# Patient Record
Sex: Female | Born: 1965 | Race: White | Hispanic: No | Marital: Married | Smoking: Never smoker
Health system: Southern US, Community
[De-identification: ages and names within clinical notes are randomized; demographics above are authoritative.]

## PROBLEM LIST (undated history)

## (undated) DIAGNOSIS — N390 Urinary tract infection, site not specified: Secondary | ICD-10-CM

## (undated) DIAGNOSIS — R87810 Cervical high risk human papillomavirus (HPV) DNA test positive: Secondary | ICD-10-CM

## (undated) DIAGNOSIS — B009 Herpesviral infection, unspecified: Secondary | ICD-10-CM

## (undated) DIAGNOSIS — R87610 Atypical squamous cells of undetermined significance on cytologic smear of cervix (ASC-US): Secondary | ICD-10-CM

## (undated) HISTORY — DX: Urinary tract infection, site not specified: N39.0

## (undated) HISTORY — DX: Herpesviral infection, unspecified: B00.9

## (undated) HISTORY — PX: TUBAL LIGATION: SHX77

## (undated) HISTORY — DX: Atypical squamous cells of undetermined significance on cytologic smear of cervix (ASC-US): R87.610

## (undated) HISTORY — DX: Cervical high risk human papillomavirus (HPV) DNA test positive: R87.810

---

## 2005-01-09 ENCOUNTER — Ambulatory Visit: Payer: Self-pay | Admitting: Obstetrics and Gynecology

## 2006-07-01 ENCOUNTER — Ambulatory Visit: Payer: Self-pay | Admitting: Obstetrics and Gynecology

## 2008-02-15 ENCOUNTER — Ambulatory Visit: Payer: Self-pay | Admitting: Obstetrics and Gynecology

## 2011-07-09 ENCOUNTER — Ambulatory Visit: Payer: Self-pay | Admitting: Obstetrics and Gynecology

## 2012-07-21 ENCOUNTER — Ambulatory Visit: Payer: Self-pay | Admitting: Obstetrics and Gynecology

## 2015-04-02 ENCOUNTER — Encounter: Payer: Self-pay | Admitting: *Deleted

## 2015-04-02 ENCOUNTER — Ambulatory Visit (INDEPENDENT_AMBULATORY_CARE_PROVIDER_SITE_OTHER): Payer: BLUE CROSS/BLUE SHIELD | Admitting: Obstetrics and Gynecology

## 2015-04-02 VITALS — BP 105/69 | HR 69 | Resp 16 | Ht 63.0 in | Wt 133.6 lb

## 2015-04-02 DIAGNOSIS — Z113 Encounter for screening for infections with a predominantly sexual mode of transmission: Secondary | ICD-10-CM

## 2015-04-02 DIAGNOSIS — R300 Dysuria: Secondary | ICD-10-CM | POA: Diagnosis not present

## 2015-04-02 DIAGNOSIS — B3731 Acute candidiasis of vulva and vagina: Secondary | ICD-10-CM

## 2015-04-02 DIAGNOSIS — B373 Candidiasis of vulva and vagina: Secondary | ICD-10-CM | POA: Diagnosis not present

## 2015-04-02 DIAGNOSIS — R3129 Other microscopic hematuria: Secondary | ICD-10-CM | POA: Diagnosis not present

## 2015-04-02 LAB — URINALYSIS, COMPLETE
BILIRUBIN UA: NEGATIVE
GLUCOSE, UA: NEGATIVE
KETONES UA: NEGATIVE
NITRITE UA: NEGATIVE
PROTEIN UA: NEGATIVE
RBC UA: NEGATIVE
SPEC GRAV UA: 1.01 (ref 1.005–1.030)
UUROB: 0.2 mg/dL (ref 0.2–1.0)
pH, UA: 6.5 (ref 5.0–7.5)

## 2015-04-02 LAB — MICROSCOPIC EXAMINATION: RBC MICROSCOPIC, UA: NONE SEEN /HPF (ref 0–?)

## 2015-04-02 LAB — BLADDER SCAN AMB NON-IMAGING

## 2015-04-02 NOTE — Patient Instructions (Addendum)
Cystoscopy Cystoscopy is a procedure that is used to help your caregiver diagnose and sometimes treat conditions that affect your lower urinary tract. Your lower urinary tract includes your bladder and the tube through which urine passes from your bladder out of your body (urethra). Cystoscopy is performed with a thin, tube-shaped instrument (cystoscope). The cystoscope has lenses and a light at the end so that your caregiver can see inside your bladder. The cystoscope is inserted at the entrance of your urethra. Your caregiver guides it through your urethra and into your bladder. There are two main types of cystoscopy:  Flexible cystoscopy (with a flexible cystoscope).  Rigid cystoscopy (with a rigid cystoscope). Cystoscopy may be recommended for many conditions, including:  Urinary tract infections.  Blood in your urine (hematuria).  Loss of bladder control (urinary incontinence) or overactive bladder.  Unusual cells found in a urine sample.  Urinary blockage.  Painful urination. Cystoscopy may also be done to remove a sample of your tissue to be checked under a microscope (biopsy). It may also be done to remove or destroy bladder stones. LET YOUR CAREGIVER KNOW ABOUT:  Allergies to food or medicine.  Medicines taken, including vitamins, herbs, eyedrops, over-the-counter medicines, and creams.  Use of steroids (by mouth or creams).  Previous problems with anesthetics or numbing medicines.  History of bleeding problems or blood clots.  Previous surgery.  Other health problems, including diabetes and kidney problems.  Possibility of pregnancy, if this applies. PROCEDURE The area around the opening to your urethra will be cleaned. A medicine to numb your urethra (local anesthetic) is used. If a tissue sample or stone is removed during the procedure, you may be given a medicine to make you sleep (general anesthetic). Your caregiver will gently insert the tip of the cystoscope  into your urethra. The cystoscope will be slowly glided through your urethra and into your bladder. Sterile fluid will flow through the cystoscope and into your bladder. The fluid will expand and stretch your bladder. This gives your caregiver a better view of your bladder walls. The procedure lasts about 15-20 minutes. AFTER THE PROCEDURE If a local anesthetic is used, you will be allowed to go home as soon as you are ready. If a general anesthetic is used, you will be taken to a recovery area until you are stable. You may have temporary bleeding and burning on urination.   This information is not intended to replace advice given to you by your health care provider. Make sure you discuss any questions you have with your health care provider.   Document Released: 02/29/2000 Document Revised: 03/24/2014 Document Reviewed: 08/25/2011 Elsevier Interactive Patient Education 2016 Elsevier Inc. Hematuria, Adult Hematuria is blood in your urine. It can be caused by a bladder infection, kidney infection, prostate infection, kidney stone, or cancer of your urinary tract. Infections can usually be treated with medicine, and a kidney stone usually will pass through your urine. If neither of these is the cause of your hematuria, further workup to find out the reason may be needed. It is very important that you tell your health care provider about any blood you see in your urine, even if the blood stops without treatment or happens without causing pain. Blood in your urine that happens and then stops and then happens again can be a symptom of a very serious condition. Also, pain is not a symptom in the initial stages of many urinary cancers. HOME CARE INSTRUCTIONS   Drink lots of fluid, 3-4   quarts a day. If you have been diagnosed with an infection, cranberry juice is especially recommended, in addition to large amounts of water.  Avoid caffeine, tea, and carbonated beverages because they tend to irritate the  bladder.  Avoid alcohol because it may irritate the prostate.  Take all medicines as directed by your health care provider.  If you were prescribed an antibiotic medicine, finish it all even if you start to feel better.  If you have been diagnosed with a kidney stone, follow your health care provider's instructions regarding straining your urine to catch the stone.  Empty your bladder often. Avoid holding urine for long periods of time.  After a bowel movement, women should cleanse front to back. Use each tissue only once.  Empty your bladder before and after sexual intercourse if you are a female. SEEK MEDICAL CARE IF:  You develop back pain.  You have a fever.  You have a feeling of sickness in your stomach (nausea) or vomiting.  Your symptoms are not better in 3 days. Return sooner if you are getting worse. SEEK IMMEDIATE MEDICAL CARE IF:   You develop severe vomiting and are unable to keep the medicine down.  You develop severe back or abdominal pain despite taking your medicines.  You begin passing a large amount of blood or clots in your urine.  You feel extremely weak or faint, or you pass out. MAKE SURE YOU:   Understand these instructions.  Will watch your condition.  Will get help right away if you are not doing well or get worse.   This information is not intended to replace advice given to you by your health care provider. Make sure you discuss any questions you have with your health care provider.   Document Released: 03/03/2005 Document Revised: 03/24/2014 Document Reviewed: 11/01/2012 Elsevier Interactive Patient Education [employer] CT Scan A computed tomography (CT) scan is a specialized X-ray scan. It uses X-rays and a computer to make pictures of different areas of your body. A CT scan can offer more detailed information than a regular X-ray exam. The CT scan provides data about internal organs, soft tissue structures, blood vessels, and  bones.  The CT scanner is a large machine that takes pictures of your body as you move through the opening.  LET [facility] CARE PROVIDER KNOW ABOUT:  Any allergies you have.   All medicines you are taking, including vitamins, herbs, eye drops, creams, and over-the-counter medicines.   Previous problems you or members of your family have had with the use of anesthetics.   Any blood disorders you have.   Previous surgeries you have had.   Medical conditions you have. RISKS AND COMPLICATIONS  Generally, this is a safe procedure. However, as with any procedure, problems can occur. Possible problems include:   An allergic reaction to the contrast material.   Development of cancer from excessive exposure to radiation. The risk of this is small.  BEFORE THE PROCEDURE   The day before the test, stop drinking caffeinated beverages. These include energy drinks, tea, soda, coffee, and hot chocolate.   On the day of the test:  About 4 hours before the test, stop eating and drinking anything but water as advised by your health care provider.   Avoid wearing jewelry. You will have to partly or fully undress and wear a hospital gown. PROCEDURE   You will be asked to lie on a table with your arms above your head.   If  contrast dye is to be used for the test, an IV tube will be inserted in your arm. The contrast dye will be injected into the IV tube. You might feel warm, or you may get a metallic taste in your mouth.   The table you will be lying on will move into a large machine that will do the scanning.   You will be able to see, hear, and talk to the person running the machine while you are in it. Follow that person's directions.   The CT machine will move around you to take pictures. Do not move while it is scanning. This helps to get a good image.   When the best possible pictures have been taken, the machine will be turned off. The table will be moved out of the  machine. The IV tube will then be removed. AFTER THE PROCEDURE  Ask your health care provider when to follow up for your test results.   This information is not intended to replace advice given to you by your health care provider. Make sure you discuss any questions you have with your health care provider.   Document Released: 04/10/2004 Document Revised: 03/08/2013 Document Reviewed: 11/08/2012 Elsevier Interactive Patient Education [employer] Inc[employer]

## 2015-04-02 NOTE — Progress Notes (Signed)
u01/16/2017 11:27 AM   Tamara Simmons 05/11/1965 [id number]  Referring provider: No referring provider defined for this encounter.  Chief Complaint  Patient presents with  . Recurrent UTI  . Establish Care    HPI: Patient is a 50 year old female presenting today with complaints of urinary urgency, frequency and a tingling sensation when she urinates. She reports that symptoms are typically worse in the second or third day after she has intercourse. She does occasionally experience a small amount spotting after intercourse.   Vaginal symptoms include dryness and mild dyspareunia.   Never smoker. No environmental chemical exposures.   After review of her previous labs it was also noted that she has had recurrent microscopic hematuria with negative urine cultures.  03/28/15   UA -nitrite, RBC 0-3, Urine culture negative 03/09/15 UA +nitrite positive, RBC 4-10 Urine Culture negative     PMH: Past Medical History  Diagnosis Date  . ASCUS with positive high risk HPV cervical   . HSV-2 infection   . Recurrent UTI     Surgical History: Past Surgical History  Procedure Laterality Date  . Cesarean section    . Tubal ligation      Home Medications:    Medication List       This list is accurate as of: 04/02/15 11:59 PM.  Always use your most recent med list.               ciprofloxacin 250 MG tablet  Commonly known as:  CIPRO  Take by mouth.     [facility]/CRANBERRY 450-30 MG Tabs  Take by mouth.     MULTI-VITAMINS Tabs  Take by mouth.     spironolactone 100 MG tablet  Commonly known as:  ALDACTONE  Take by mouth.     [facility] WORT PO  Take by mouth.        Allergies: No Known Allergies  Family History: Family History  Problem Relation Age of Onset  . Prostate cancer Father   . Diabetes Mother   . Lung cancer Mother     Social History:  reports that she has never smoked. She does not have any smokeless tobacco history on file. She reports  that she drinks alcohol. Her drug history is not on file.  ROS: UROLOGY Frequent Urination?: No Hard to postpone urination?: No Burning/pain with urination?: Yes Get up at night to urinate?: No Leakage of urine?: No Urine stream starts and stops?: No Trouble starting stream?: No Do you have to strain to urinate?: No Blood in urine?: No Urinary tract infection?: Yes Sexually transmitted disease?: No Injury to kidneys or bladder?: No Painful intercourse?: No Weak stream?: No Currently pregnant?: No Vaginal bleeding?: No Last menstrual period?: 03/18/15  Gastrointestinal Nausea?: No Vomiting?: No Indigestion/heartburn?: No Diarrhea?: No Constipation?: Yes  Constitutional Fever: No Night sweats?: Yes Weight loss?: No Fatigue?: Yes  Skin Skin rash/lesions?: No Itching?: No  Eyes Blurred vision?: No Double vision?: No  Ears/Nose/Throat Sore throat?: No Sinus problems?: No  Hematologic/Lymphatic Swollen glands?: No Easy bruising?: No  Cardiovascular Leg swelling?: No Chest pain?: No  Respiratory Cough?: No Shortness of breath?: No  Endocrine Excessive thirst?: No  Musculoskeletal Back pain?: Yes Joint pain?: No  Neurological Headaches?: Yes Dizziness?: Yes  Psychologic Depression?: No Anxiety?: No  Physical Exam: BP 105/69 mmHg  Pulse 69  Resp 16  Ht 5\' 3"  (1.6 m)  Wt 133 lb 9.6 oz (60.601 kg)  BMI 23.67 kg/m2  Constitutional:  Alert and oriented,  No acute distress. HEENT: [place] AT, moist mucus membranes.  Trachea midline, no masses. Cardiovascular: No clubbing, cyanosis, or edema. Respiratory: Normal respiratory effort, no increased work of breathing. GI: Abdomen is soft, nontender, nondistended, no abdominal masses GU: No CVA tenderness.  Pelvic Exam: white milky vaginal discharge, good vaginal vault support, mucous membranes moist will decreased rugae Skin: No rashes, bruises or suspicious lesions. Lymph: No cervical or inguinal  adenopathy. Neurologic: Grossly intact, no focal deficits, moving all 4 extremities. Psychiatric: Normal mood and affect.  Laboratory Data:   Urinalysis Results for orders placed or performed in visit on 04/02/15  Microscopic Examination  Result Value Ref Range   WBC, UA 6-10 (A) 0 -  5 /hpf   RBC, UA None seen 0 -  2 /hpf   Epithelial Cells (non renal) 0-10 0 - 10 /hpf   Mucus, UA Present (A) Not Estab.   Bacteria, UA Few (A) None seen/Few  Urinalysis, Complete  Result Value Ref Range   Specific Gravity, UA 1.010 1.005 - 1.030   pH, UA 6.5 5.0 - 7.5   Color, UA Yellow Yellow   Appearance Ur Clear Clear   Leukocytes, UA 1+ (A) Negative   Protein, UA Negative Negative/Trace   Glucose, UA Negative Negative   Ketones, UA Negative Negative   RBC, UA Negative Negative   Bilirubin, UA Negative Negative   Urobilinogen, Ur 0.2 0.2 - 1.0 mg/dL   Nitrite, UA Negative Negative   Microscopic Examination See below:   BLADDER SCAN AMB NON-IMAGING  Result Value Ref Range   Scan Result 98 mL      Pertinent Imaging: Results for orders placed or performed in visit on 04/02/15  Microscopic Examination  Result Value Ref Range   WBC, UA 6-10 (A) 0 -  5 /hpf   RBC, UA None seen 0 -  2 /hpf   Epithelial Cells (non renal) 0-10 0 - 10 /hpf   Mucus, UA Present (A) Not Estab.   Bacteria, UA Few (A) None seen/Few  Urinalysis, Complete  Result Value Ref Range   Specific Gravity, UA 1.010 1.005 - 1.030   pH, UA 6.5 5.0 - 7.5   Color, UA Yellow Yellow   Appearance Ur Clear Clear   Leukocytes, UA 1+ (A) Negative   Protein, UA Negative Negative/Trace   Glucose, UA Negative Negative   Ketones, UA Negative Negative   RBC, UA Negative Negative   Bilirubin, UA Negative Negative   Urobilinogen, Ur 0.2 0.2 - 1.0 mg/dL   Nitrite, UA Negative Negative   Microscopic Examination See below:   BLADDER SCAN AMB NON-IMAGING  Result Value Ref Range   Scan Result 98 mL     Assessment and Plan:  1.  Microscopic hematuria- We discussed the differential diagnosis for microscopic hematuria including nephrolithiasis, renal or upper tract tumors, bladder stones, UTIs, or bladder tumors as well as undetermined etiologies. Per AUA guidelines, I did recommend complete microscopic hematuria evaluation including CTU, possible urine cytology, and office cystoscopy.   - Urinalysis, Complete - BLADDER SCAN AMB NON-IMAGING  2. Dysuria- We will reassess urinary symptoms months hematuria workup is complete.  3. Yeast vaginitis- Prescribed Diflucan.  4. STI screening-  -GC/chlamydia   Return for CT Urogram results and cystoscopy.  These notes generated with voice recognition software. I apologize for typographical errors.  Earlie Lou, FNP  [facility] [address], [address] [place], [place] [address] [phone]

## 2015-04-03 LAB — GC/CHLAMYDIA PROBE AMP
CHLAMYDIA, DNA PROBE: NEGATIVE
Neisseria gonorrhoeae by PCR: NEGATIVE

## 2015-04-09 ENCOUNTER — Telehealth: Payer: Self-pay

## 2015-04-09 NOTE — Telephone Encounter (Signed)
-----   Message from Fernanda Drum, FNP sent at 04/06/2015  1:40 PM EST ----- Please notify patient that her test for [facility] and chlamydia was negative.  Thanks

## 2015-04-09 NOTE — Telephone Encounter (Signed)
Spoke with pt and made aware GC/chlamydia test is negative. Pt voiced understanding.

## 2015-04-13 ENCOUNTER — Ambulatory Visit
Admission: RE | Admit: 2015-04-13 | Discharge: 2015-04-13 | Disposition: A | Discharge status: Home / Self Care | Payer: BLUE CROSS/BLUE SHIELD | Source: Ambulatory Visit | Attending: Obstetrics and Gynecology | Admitting: Obstetrics and Gynecology

## 2015-04-13 DIAGNOSIS — K5641 Fecal impaction: Secondary | ICD-10-CM | POA: Insufficient documentation

## 2015-04-13 DIAGNOSIS — K7689 Other specified diseases of liver: Secondary | ICD-10-CM | POA: Diagnosis not present

## 2015-04-13 DIAGNOSIS — N859 Noninflammatory disorder of uterus, unspecified: Secondary | ICD-10-CM | POA: Diagnosis not present

## 2015-04-13 DIAGNOSIS — R3129 Other microscopic hematuria: Secondary | ICD-10-CM | POA: Diagnosis not present

## 2015-04-13 MED ORDER — IOHEXOL 300 MG/ML  SOLN
125.0000 mL | Freq: Once | INTRAMUSCULAR | Status: AC | PRN
  Administered 2015-04-13: 125 mL via INTRAVENOUS

## 2015-04-20 ENCOUNTER — Encounter: Payer: Self-pay | Admitting: Urology

## 2015-04-20 ENCOUNTER — Ambulatory Visit (INDEPENDENT_AMBULATORY_CARE_PROVIDER_SITE_OTHER): Payer: BLUE CROSS/BLUE SHIELD | Admitting: Urology

## 2015-04-20 VITALS — BP 105/75 | HR 79 | Ht 63.0 in | Wt 131.8 lb

## 2015-04-20 DIAGNOSIS — R3129 Other microscopic hematuria: Secondary | ICD-10-CM | POA: Diagnosis not present

## 2015-04-20 LAB — URINALYSIS, COMPLETE
BILIRUBIN UA: NEGATIVE
GLUCOSE, UA: NEGATIVE
Ketones, UA: NEGATIVE
LEUKOCYTES UA: NEGATIVE
Nitrite, UA: NEGATIVE
PROTEIN UA: NEGATIVE
Specific Gravity, UA: <1.005 — ABNORMAL LOW (ref 1.005–1.030)
UUROB: 0.2 mg/dL (ref 0.2–1.0)
pH, UA: 6 (ref 5.0–7.5)

## 2015-04-20 LAB — MICROSCOPIC EXAMINATION
BACTERIA UA: NONE SEEN
RBC, UA: NONE SEEN /hpf (ref 0–?)

## 2015-04-20 LAB — BLADDER SCAN AMB NON-IMAGING: SCAN RESULT: 20

## 2015-04-20 MED ORDER — LIDOCAINE HCL 2 % EX GEL
1.0000 "application " | Freq: Once | CUTANEOUS | Status: AC
  Administered 2015-04-20: 1 via URETHRAL

## 2015-04-20 MED ORDER — CIPROFLOXACIN HCL 500 MG PO TABS
500.0000 mg | ORAL_TABLET | Freq: Once | ORAL | Status: AC
  Administered 2015-04-20: 500 mg via ORAL

## 2015-04-20 NOTE — Progress Notes (Addendum)
04/20/2015 2:44 PM   Tamara Simmons Sep 01, 1965 [id number]  Referring provider: Marisue Ivan, MD [phone] [facility] [address] [facility] [place], [place] [address]  Chief Complaint  Patient presents with  . Cysto    HPI: Patient is a 50 year old female presenting today with complaints of urinary urgency, frequency and a tingling sensation when she urinates. She reports that symptoms are typically worse in the second or third day after she has intercourse. She does occasionally experience a small amount spotting after intercourse.  Vaginal symptoms include dryness and mild dyspareunia.   Never smoker. No environmental chemical exposures.   After review of her previous labs it was also noted that she has had recurrent microscopic hematuria with negative urine cultures.  03/28/15 UA -nitrite, RBC 0-3, Urine culture negative 03/09/15 UA +nitrite positive, RBC 4-10 Urine Culture negative   February 2017 interval history: The patient follows up for completion of her hematuria workup. CT urogram was negative. Cystoscopy was unremarkable.  The patient also reports indicated that she's had approximate 4 urinary tract infections in the last 6 months, however review of her cultures again shows no positive cultures that are available to me. She is not having symptoms now.    PMH: Past Medical History  Diagnosis Date  . ASCUS with positive high risk HPV cervical   . HSV-2 infection   . Recurrent UTI     Surgical History: Past Surgical History  Procedure Laterality Date  . Cesarean section    . Tubal ligation      Home Medications:    Medication List       This list is accurate as of: 04/20/15  2:44 PM.  Always use your most recent med list.               [facility]/CRANBERRY 450-30 MG Tabs  Take by mouth.     MULTI-VITAMINS Tabs  Take by mouth.     spironolactone 100 MG tablet  Commonly known as:  ALDACTONE  Take by mouth.     [facility] WORT PO   Take by mouth.        Allergies: No Known Allergies  Family History: Family History  Problem Relation Age of Onset  . Prostate cancer Father   . Diabetes Mother   . Lung cancer Mother     Social History:  reports that she has never smoked. She does not have any smokeless tobacco history on file. She reports that she drinks alcohol. Her drug history is not on file.  ROS: UROLOGY Frequent Urination?: No Hard to postpone urination?: No Burning/pain with urination?: No Get up at night to urinate?: No Leakage of urine?: No Urine stream starts and stops?: No Trouble starting stream?: No Do you have to strain to urinate?: No Blood in urine?: No Urinary tract infection?: Yes Sexually transmitted disease?: No Injury to kidneys or bladder?: No Painful intercourse?: No Weak stream?: No Currently pregnant?: No Vaginal bleeding?: No Last menstrual period?: 03/28/15  Gastrointestinal Nausea?: No Vomiting?: No Indigestion/heartburn?: No Diarrhea?: No Constipation?: Yes  Constitutional Fever: No Night sweats?: No Weight loss?: No Fatigue?: No  Skin Skin rash/lesions?: No Itching?: No  Eyes Blurred vision?: No Double vision?: No  Ears/Nose/Throat Sore throat?: No Sinus problems?: No  Hematologic/Lymphatic Swollen glands?: No Easy bruising?: No  Cardiovascular Leg swelling?: No Chest pain?: No  Respiratory Cough?: No Shortness of breath?: No  Endocrine Excessive thirst?: No  Musculoskeletal Back pain?: No Joint pain?: No  Neurological Headaches?: No  Dizziness?: No  Psychologic Depression?: No Anxiety?: No  Physical Exam: BP 105/75 mmHg  Pulse 79  Ht  (1.6 m)  Wt 131 lb 12.8 oz (59.784 kg)  BMI 23.35 kg/m2  LMP 03/18/2015  Constitutional:  Alert and oriented, No acute distress. HEENT: [place] AT, moist mucus membranes.  Trachea midline, no masses. Cardiovascular: No clubbing, cyanosis, or edema. Respiratory: Normal respiratory effort,  no increased work of breathing. GI: Abdomen is soft, nontender, nondistended, no abdominal masses GU: No CVA tenderness.  Skin: No rashes, bruises or suspicious lesions. Lymph: No cervical or inguinal adenopathy. Neurologic: Grossly intact, no focal deficits, moving all 4 extremities. Psychiatric: Normal mood and affect.  Laboratory Data: No results found for: WBC, HGB, HCT, MCV, PLT  No results found for: CREATININE  No results found for: PSA  No results found for: TESTOSTERONE  No results found for: HGBA1C  Urinalysis    Component Value Date/Time   GLUCOSEU Negative 04/02/2015 1525   BILIRUBINUR Negative 04/02/2015 1525   NITRITE Negative 04/02/2015 1525   LEUKOCYTESUR 1+* 04/02/2015 1525    Pertinent Imaging:  CLINICAL DATA: Urinary frequency and dysuria. Mild dyspareunia. Recurrent microscopic hematuria.  EXAM: CT ABDOMEN AND PELVIS WITHOUT AND WITH CONTRAST  TECHNIQUE: Multidetector CT imaging of the abdomen and pelvis was performed following the standard protocol before and following the bolus administration of intravenous contrast.  CONTRAST: OMNIPAQUE IOHEXOL 300 MG/ML SOLN  COMPARISON: None.  FINDINGS: Lower chest: Unremarkable  Hepatobiliary: Approximately 7 nonenhancing fluid density hepatic lesions are visible, measuring up to 1.9 cm in long axis, without apparent delayed enhancement, compatible with cysts. Several of these lesions are tiny and technically too small to characterize, although statistically likely to be cysts.  Gallbladder unremarkable. No biliary dilatation.  Pancreas: Heterogeneous density just below the pancreatic tail, shown on multiplanar imaging to represent clumped loops of small bowel rather than a pancreatic mass. Overall the pancreas appears unremarkable.  Spleen: Unremarkable  Adrenals/Urinary Tract: No urinary tract calculi, abnormal renal parenchymal enhancement, or abnormal urographic phase  filling defect along the urothelium to explain the patient's hematuria. Adrenal glands normal.  Stomach/Bowel: Wall thickening in the descending duodenum on portal venous phase images does not persist on the delayed phase images and accordingly was probably from transient peristaltic activity. Prominent stool throughout the colon favors constipation. Appendix normal.  Vascular/Lymphatic: Minimal aortic atherosclerotic calcification. No adenopathy observed.  Reproductive: Heterogeneous enhancement in the uterus favoring fibroids. Cannot exclude adenomyosis. Endometrium poorly characterized. Ovaries unremarkable.  Other: No supplemental non-categorized findings.  Musculoskeletal: Unremarkable  IMPRESSION: 1. No cause for hematuria identified. 2. Heterogeneous enhancement in the uterus favoring scattered fibroids, although somewhat irregular. Adenomyosis is not excluded. Consider pelvic sonography for further characterization. 3. Hepatic cysts, benign imaging characteristics, although some of these hypodense lesions are technically too small to characterize. 4. Prominent stool throughout the colon favors constipation.     Cystoscopy Procedure Note  Patient identification was confirmed, informed consent was obtained, and patient was prepped using Betadine solution.  Lidocaine jelly was administered per urethral meatus.    Preoperative abx where received prior to procedure.    Procedure: - Flexible cystoscope introduced, without any difficulty.   - Thorough search of the bladder revealed:    normal urethral meatus    normal urothelium    no stones    no ulcers     no tumors    no urethral polyps    no trabeculation  - Ureteral orifices were normal in position  and appearance.  Post-Procedure: - Patient tolerated the procedure well   Assessment & Plan:    1. Microscopic hematuria -Negative work up. Follow up in one year for urinalysis  2. Recurrent urinary  tract infection The patient was instructed to follow up with [place] if she starts developing signs and symptoms of urinary tract infection. Currently she does not have a urinary tract infection or urinary symptoms.  3. Heterogenous enhancement of the uterus The patient was given a copy of her radiology report instructed to follow up with her gynecologist. She expressed understanding.   Return in about 1 year (around 04/19/2016) for urinalysis.  Hildred Laser, MD  [facility] [address] [place], [place] [address] [phone]

## 2016-04-25 ENCOUNTER — Encounter: Payer: Self-pay | Admitting: Urology

## 2016-04-25 ENCOUNTER — Ambulatory Visit (INDEPENDENT_AMBULATORY_CARE_PROVIDER_SITE_OTHER): Payer: BLUE CROSS/BLUE SHIELD | Admitting: Urology

## 2016-04-25 VITALS — BP 108/72 | HR 69 | Ht 63.0 in | Wt 132.9 lb

## 2016-04-25 DIAGNOSIS — R3129 Other microscopic hematuria: Secondary | ICD-10-CM | POA: Diagnosis not present

## 2016-04-25 LAB — URINALYSIS, COMPLETE
BILIRUBIN UA: NEGATIVE
Glucose, UA: NEGATIVE
Ketones, UA: NEGATIVE
LEUKOCYTES UA: NEGATIVE
Nitrite, UA: NEGATIVE
PH UA: 7 (ref 5.0–7.5)
PROTEIN UA: NEGATIVE
RBC, UA: NEGATIVE
SPEC GRAV UA: 1.01 (ref 1.005–1.030)
Urobilinogen, Ur: 0.2 mg/dL (ref 0.2–1.0)

## 2016-04-25 LAB — MICROSCOPIC EXAMINATION: RBC, UA: NONE SEEN /hpf (ref 0–?)

## 2016-04-25 NOTE — Progress Notes (Signed)
04/25/2016 1:55 PM   Tamara Simmons 07/10/1965 [id number]  Referring provider: Marisue IvanKanhka Linthavong, MD [phone] [facility] [place], [place] [id number]  Chief Complaint  Patient presents with  . Hematuria    HPI: The patient is a 51 year old female presents today for annual follow-up.  1. Microscopic hematuria The patient  follows up after a negative hematuria workup in February 2017. She has never smoked and has no environmental chemical exposures. She has done well over the last year. She has had no urinary tract infections. She has had no gross hematuria. She has had no nephrolithiasis. She feels she empties her bladder well. She has no other complaints at this time.   PMH: Past Medical History:  Diagnosis Date  . ASCUS with positive high risk HPV cervical   . HSV-2 infection   . Recurrent UTI     Surgical History: Past Surgical History:  Procedure Laterality Date  . CESAREAN SECTION    . TUBAL LIGATION      Home Medications:  Allergies as of 04/25/2016   No Known Allergies     Medication List       Accurate as of 04/25/16  1:55 PM. Always use your most recent med list.          [facility] URIN[facility] HEALTH/CRANBERRY 450-30 MG Tabs Take by mouth.   MULTI-VITAMINS Tabs Take by mouth.   spironolactone 100 MG tablet Commonly known as:  ALDACTONE Take by mouth.   ST JOHNS WORT PO Take by mouth.       Allergies: No Known Allergies  Family History: Family History  Problem Relation Age of Onset  . Prostate cancer Father   . Diabetes Mother   . Lung cancer Mother   . Bladder Cancer Neg Hx   . Kidney cancer Neg Hx     Social History:  reports that she has never smoked. She has never used smokeless tobacco. She reports that she drinks alcohol. She reports that she does not use drugs.  ROS: UROLOGY Frequent Urination?: No Hard to postpone urination?: No Burning/pain with urination?: No Get up at night to urinate?: No Leakage of  urine?: No Urine stream starts and stops?: No Trouble starting stream?: No Do you have to strain to urinate?: No Blood in urine?: No Urinary tract infection?: Yes Sexually transmitted disease?: No Injury to kidneys or bladder?: No Painful intercourse?: No Weak stream?: No Currently pregnant?: No Vaginal bleeding?: No Last menstrual period?: n  Gastrointestinal Nausea?: No Vomiting?: No Indigestion/heartburn?: No Diarrhea?: No Constipation?: Yes  Constitutional Fever: No Night sweats?: No Weight loss?: No Fatigue?: No  Skin Skin rash/lesions?: No Itching?: No  Eyes Blurred vision?: No Double vision?: No  Ears/Nose/Throat Sore throat?: No Sinus problems?: No  Hematologic/Lymphatic Swollen glands?: No Easy bruising?: No  Cardiovascular Leg swelling?: No Chest pain?: No  Respiratory Cough?: No Shortness of breath?: No  Endocrine Excessive thirst?: No  Musculoskeletal Back pain?: No Joint pain?: No  Neurological Headaches?: No Dizziness?: No  Psychologic Depression?: No Anxiety?: No  Physical Exam: BP 108/72 (BP Location: Left Arm, Patient Position: Sitting, Cuff Size: Normal)   Pulse 69   Ht 5\' 3"  (1.6 m)   Wt 132 lb 14.4 oz (60.3 kg)   BMI 23.54 kg/m   Constitutional:  Alert and oriented, No acute distress. HEENT: [place] AT, moist mucus membranes.  Trachea midline, no masses. Cardiovascular: No clubbing, cyanosis, or edema. Respiratory: Normal respiratory effort, no increased work of breathing. GI: Abdomen is soft,  nontender, nondistended, no abdominal masses GU: No CVA tenderness.  Skin: No rashes, bruises or suspicious lesions. Lymph: No cervical or inguinal adenopathy. Neurologic: Grossly intact, no focal deficits, moving all 4 extremities. Psychiatric: Normal mood and affect.  Laboratory Data: No results found for: WBC, HGB, HCT, MCV, PLT  No results found for: CREATININE  No results found for: PSA  No results found for:  TESTOSTERONE  No results found for: HGBA1C  Urinalysis    Component Value Date/Time   APPEARANCEUR Clear 04/20/2015 1404   GLUCOSEU Negative 04/20/2015 1404   BILIRUBINUR Negative 04/20/2015 1404   PROTEINUR Negative 04/20/2015 1404   NITRITE Negative 04/20/2015 1404   LEUKOCYTESUR Negative 04/20/2015 1404      Assessment & Plan:    1. Microscopic hematuria -Negative workup in 2017. Urinalysis is negative today for blood. Per AUA guidelines, she will follow-up in one year for a final repeat urinalysis. If this is normal, she can follow up at that point as needed.  Return in about 1 year (around 04/25/2017) for for repeat u/a.  Hildred Laser, MD  [facility] [address], [address] [place], [place] [phone]

## 2017-04-23 ENCOUNTER — Ambulatory Visit: Payer: BLUE CROSS/BLUE SHIELD

## 2017-07-31 ENCOUNTER — Encounter: Payer: Self-pay | Admitting: Emergency Medicine

## 2017-07-31 ENCOUNTER — Emergency Department
Admission: EM | Admit: 2017-07-31 | Discharge: 2017-07-31 | Disposition: A | Discharge status: Home / Self Care | Payer: BLUE CROSS/BLUE SHIELD | Attending: Emergency Medicine | Admitting: Emergency Medicine

## 2017-07-31 DIAGNOSIS — Z203 Contact with and (suspected) exposure to rabies: Secondary | ICD-10-CM | POA: Diagnosis not present

## 2017-07-31 DIAGNOSIS — Z23 Encounter for immunization: Secondary | ICD-10-CM | POA: Diagnosis not present

## 2017-07-31 DIAGNOSIS — Z2914 Encounter for prophylactic rabies immune globin: Secondary | ICD-10-CM | POA: Insufficient documentation

## 2017-07-31 MED ORDER — RABIES IMMUNE GLOBULIN 150 UNIT/ML IM INJ
1200.0000 [IU] | INJECTION | Freq: Once | INTRAMUSCULAR | Status: AC
  Administered 2017-07-31: 1200 [IU] via INTRAMUSCULAR
  Filled 2017-07-31: qty 8

## 2017-07-31 MED ORDER — RABIES VACCINE, PCEC IM SUSR
1.0000 mL | Freq: Once | INTRAMUSCULAR | Status: AC
  Administered 2017-07-31: 1 mL via INTRAMUSCULAR
  Filled 2017-07-31: qty 1

## 2017-07-31 NOTE — ED Triage Notes (Signed)
Pt to ED after pet dog came in contact with racoon w/ possible rabies.  

## 2017-07-31 NOTE — ED Notes (Signed)
Pt verbalized understanding of discharge instructions. NAD at this time. 

## 2017-07-31 NOTE — ED Provider Notes (Signed)
[facility] Emergency Department Provider Note  ____________________________________________   First MD Initiated Contact with Patient 07/31/17 [phone]     (approximate)  I have reviewed the triage vital signs and the nursing notes.   HISTORY  Chief Complaint Animal Bite   HPI Tamara Simmons is a 52 y.o. female is here per instructions of the [facility] department for rabies immunization.  Patient and husband was walking there [place] terrier when it was attacked by a raccoon.  Husband had to grab the raccoon by the neck to remove the raccoon from the dog.  The family then took the dog back home where they took care of some minor wounds with contact with saliva.  They were advised by the health department to come to the ED for immunization.  Past Medical History:  Diagnosis Date  . ASCUS with positive high risk HPV cervical   . HSV-2 infection   . Recurrent UTI     There are no active problems to display for this patient.   Past Surgical History:  Procedure Laterality Date  . CESAREAN SECTION    . TUBAL LIGATION      Prior to Admission medications   Medication Sig Start Date End Date Taking? Authorizing Provider  Cranberry-Vit C-Lactobacillus ([facility] URINARY HEALTH/CRANBERRY) 450-30 MG TABS Take by mouth.    [provider]  Multiple Vitamin (MULTI-VITAMINS) TABS Take by mouth.    [provider]  spironolactone (ALDACTONE) 100 MG tablet Take by mouth.    [provider]  ST JOHNS WORT PO Take by mouth.    [provider]    Allergies Patient has no known allergies.  Family History  Problem Relation Age of Onset  . Prostate cancer Father   . Diabetes Mother   . Lung cancer Mother   . Bladder Cancer Neg Hx   . Kidney cancer Neg Hx     Social History Social History   Tobacco Use  . Smoking status: Never Smoker  . Smokeless tobacco: Never Used  Substance Use Topics  . Alcohol use:  Yes    Alcohol/week: 0.0 oz  . Drug use: No    Review of Systems Constitutional: No fever/chills Cardiovascular: Denies chest pain. Respiratory: Denies shortness of breath. Skin: Negative for known skin injury ____________________________________________   PHYSICAL EXAM:  VITAL SIGNS: ED Triage Vitals [07/31/17 0936]  Enc Vitals Group     BP 128/74     Pulse Rate 67     Resp 18     Temp 97.7 F (36.5 C)     Temp Source Oral     SpO2 100 %     Weight 125 lb (56.7 kg)     Height  (1.6 m)     Head Circumference      Peak Flow      Pain Score      Pain Loc      Pain Edu?      Excl. in GC?    Constitutional: Alert and oriented. Well appearing and in no acute distress. Eyes: Conjunctivae are normal.  Head: Atraumatic. Neck: No stridor.   Cardiovascular: Normal rate, regular rhythm. Grossly normal heart sounds.   Respiratory: Normal respiratory effort.  No retractions. Lungs CTAB. Musculoskeletal: Moves upper and lower extremities without any difficulty.  Normal gait was noted. Neurologic:  Normal speech and language. No gross focal neurologic deficits are appreciated. Skin:  Skin is warm, dry, no rash noted. Psychiatric: Mood and  affect are normal. Speech and behavior are normal.  ____________________________________________   LABS (all labs ordered are listed, but only abnormal results are displayed)  Labs Reviewed - No data to display   PROCEDURES  Procedure(s) performed: None  Procedures  Critical Care performed: No  ____________________________________________   INITIAL IMPRESSION / ASSESSMENT AND PLAN / ED COURSE  As part of my medical decision making, I reviewed the following data within the electronic MEDICAL RECORD NUMBER Notes from prior ED visits and [place] Controlled Substance Database  Patient was sent by the [facility] department for rabies prophylaxis.  She will get remaining injections at the [facility] urgent  care. ____________________________________________   FINAL CLINICAL IMPRESSION(S) / ED DIAGNOSES  Final diagnoses:  Need for post exposure prophylaxis for rabies     ED Discharge Orders    None       Note:  This document was prepared using Dragon voice recognition software and may include unintentional dictation errors.    Tommi Rumps, PA-C 07/31/17 1145    Sharman Cheek, MD 08/01/17 [phone]

## 2017-07-31 NOTE — Discharge Instructions (Signed)
Go to [facility] urgent care for your remaining immunization injections. On 5/20, 5/24, and 5/31.

## 2017-08-03 ENCOUNTER — Ambulatory Visit
Admission: EM | Admit: 2017-08-03 | Discharge: 2017-08-03 | Disposition: A | Discharge status: Home / Self Care | Payer: BLUE CROSS/BLUE SHIELD | Attending: Family Medicine | Admitting: Family Medicine

## 2017-08-03 DIAGNOSIS — Z203 Contact with and (suspected) exposure to rabies: Secondary | ICD-10-CM

## 2017-08-03 MED ORDER — RABIES VACCINE, PCEC IM SUSR
1.0000 mL | Freq: Once | INTRAMUSCULAR | Status: AC
  Administered 2017-08-03: 1 mL via INTRAMUSCULAR

## 2017-08-03 NOTE — ED Triage Notes (Signed)
Patient here for follow up rabies vaccine.

## 2017-08-03 NOTE — ED Notes (Signed)
Patient received vaccination in right deltoid

## 2017-08-07 ENCOUNTER — Other Ambulatory Visit: Payer: Self-pay

## 2017-08-07 ENCOUNTER — Ambulatory Visit
Admission: EM | Admit: 2017-08-07 | Discharge: 2017-08-07 | Disposition: A | Discharge status: Home / Self Care | Payer: BLUE CROSS/BLUE SHIELD | Attending: Family Medicine | Admitting: Family Medicine

## 2017-08-07 DIAGNOSIS — Z203 Contact with and (suspected) exposure to rabies: Secondary | ICD-10-CM | POA: Diagnosis not present

## 2017-08-07 DIAGNOSIS — Z2914 Encounter for prophylactic rabies immune globin: Secondary | ICD-10-CM | POA: Diagnosis not present

## 2017-08-07 MED ORDER — RABIES VACCINE, PCEC IM SUSR
1.0000 mL | Freq: Once | INTRAMUSCULAR | Status: AC
  Administered 2017-08-07: 1 mL via INTRAMUSCULAR

## 2017-08-07 NOTE — ED Triage Notes (Signed)
Pateint is here for Day 7 of Rabies Vaccine. No adverse signs or symptoms from previous injection. Will return in 7 days for Day 14.

## 2017-08-07 NOTE — Discharge Instructions (Signed)
Please return for Day 14 of Rabies Vaccine on 08/14/2017.

## 2017-08-07 NOTE — ED Notes (Signed)
Patient tolerated vaccine well with no adverse signs or symptoms.

## 2017-08-14 ENCOUNTER — Ambulatory Visit
Admission: EM | Admit: 2017-08-14 | Discharge: 2017-08-14 | Disposition: A | Discharge status: Home / Self Care | Payer: BLUE CROSS/BLUE SHIELD | Attending: Internal Medicine | Admitting: Internal Medicine

## 2017-08-14 ENCOUNTER — Encounter: Payer: Self-pay | Admitting: Gynecology

## 2017-08-14 DIAGNOSIS — Z203 Contact with and (suspected) exposure to rabies: Secondary | ICD-10-CM

## 2017-08-14 DIAGNOSIS — Z23 Encounter for immunization: Secondary | ICD-10-CM | POA: Diagnosis not present

## 2017-08-14 DIAGNOSIS — W5559XS Other contact with raccoon, sequela: Secondary | ICD-10-CM | POA: Diagnosis not present

## 2017-08-14 MED ORDER — RABIES VACCINE, PCEC IM SUSR
1.0000 mL | Freq: Once | INTRAMUSCULAR | Status: AC
  Administered 2017-08-14: 1 mL via INTRAMUSCULAR

## 2017-08-14 NOTE — Discharge Instructions (Signed)
Patient receive last dose of rabies vaccine. Patient tolerate well with no complain.

## 2017-08-14 NOTE — ED Triage Notes (Signed)
Patient is here for her 4th and last Rabies vaccine. Vaccine given  In patient right arm. Patient tolerate well with no complain.

## 2018-05-26 ENCOUNTER — Other Ambulatory Visit: Payer: Self-pay | Admitting: Obstetrics & Gynecology

## 2018-05-26 ENCOUNTER — Other Ambulatory Visit: Payer: Self-pay | Admitting: Obstetrics and Gynecology

## 2018-05-26 DIAGNOSIS — Z1231 Encounter for screening mammogram for malignant neoplasm of breast: Secondary | ICD-10-CM

## 2018-12-20 ENCOUNTER — Ambulatory Visit
Admission: RE | Admit: 2018-12-20 | Discharge: 2018-12-20 | Disposition: A | Discharge status: Home / Self Care | Payer: BC Managed Care – PPO | Source: Ambulatory Visit | Attending: Obstetrics & Gynecology | Admitting: Obstetrics & Gynecology

## 2018-12-20 DIAGNOSIS — Z1231 Encounter for screening mammogram for malignant neoplasm of breast: Secondary | ICD-10-CM | POA: Diagnosis not present

## 2019-06-25 ENCOUNTER — Ambulatory Visit: Payer: BC Managed Care – PPO | Attending: Oncology

## 2019-06-25 DIAGNOSIS — Z23 Encounter for immunization: Secondary | ICD-10-CM

## 2019-06-25 NOTE — Progress Notes (Signed)
   Covid-19 Vaccination Clinic  Name:  Demiana Crumbley    MRN: [id number] DOB: [birth year]  06/25/2019  Ms. Durkee was observed post Covid-19 immunization for 15 minutes without incident. She was provided with Vaccine Information Sheet and instruction to access the V-Safe system.   Ms. Harnden was instructed to call 911 with any severe reactions post vaccine: Marland Kitchen Difficulty breathing  . Swelling of face and throat  . A fast heartbeat  . A bad rash all over body  . Dizziness and weakness   Immunizations Administered    Name Date Dose VIS Date Route   Pfizer COVID-19 Vaccine 06/25/2019  9:00 AM 0.3 mL 02/25/2019 Intramuscular   Manufacturer: [employer], [employer]   Lot: [id number]   NDC: [address]-3

## 2019-07-19 ENCOUNTER — Ambulatory Visit: Payer: BC Managed Care – PPO | Attending: Internal Medicine

## 2019-07-19 DIAGNOSIS — Z23 Encounter for immunization: Secondary | ICD-10-CM

## 2019-07-19 NOTE — Progress Notes (Signed)
   Covid-19 Vaccination Clinic  Name:  Tamara Simmons    MRN: [id number] DOB: 10-31-1965  07/19/2019  [name] was observed post Covid-19 immunization for 15 minutes without incident. She was provided with Vaccine Information Sheet and instruction to access the V-Safe system.   [name] was instructed to call 911 with any severe reactions post vaccine: Marland Kitchen Difficulty breathing  . Swelling of face and throat  . A fast heartbeat  . A bad rash all over body  . Dizziness and weakness   Immunizations Administered    Name Date Dose VIS Date Route   Pfizer COVID-19 Vaccine 07/19/2019 11:58 AM 0.3 mL 05/11/2018 Intramuscular   Manufacturer: [employer], [employer]   Lot: [id number]   NDC: [address]-9

## 2019-10-19 ENCOUNTER — Ambulatory Visit: Payer: BC Managed Care – PPO | Admitting: Dermatology

## 2019-10-19 ENCOUNTER — Encounter: Payer: Self-pay | Admitting: Dermatology

## 2019-10-19 ENCOUNTER — Other Ambulatory Visit: Payer: Self-pay

## 2019-10-19 DIAGNOSIS — L578 Other skin changes due to chronic exposure to nonionizing radiation: Secondary | ICD-10-CM

## 2019-10-19 DIAGNOSIS — L814 Other melanin hyperpigmentation: Secondary | ICD-10-CM

## 2019-10-19 DIAGNOSIS — L700 Acne vulgaris: Secondary | ICD-10-CM | POA: Diagnosis not present

## 2019-10-19 NOTE — Progress Notes (Signed)
   Follow-Up Visit   Subjective  Tamara Simmons is a 54 y.o. female who presents for the following: Acne (follow up - Spironolactone 25mg  2 po qd, Aczone 7.5% gel bid - acne is well controlled).  The following portions of the chart were reviewed this encounter and updated as appropriate:  Tobacco  Allergies  Meds  Problems  Med Hx  Surg Hx  Fam Hx     Review of Systems:  No other skin or systemic complaints except as noted in HPI or Assessment and Plan.  Objective  Well appearing patient in no apparent distress; mood and affect are within normal limits.  A focused examination was performed including face, arms. Relevant physical exam findings are noted in the Assessment and Plan.  Objective  Face: Pink macule of chin, otherwise face is clear. Some old scarring chin.  BP - 90/67 Pulse - 67   Assessment & Plan  Acne vulgaris Face  Continue Spironolactone 25mg  2 po qd - patient has refills  Continue Aczone 7.5% gel qd-bid - patient has  Actinic Damage - diffuse scaly erythematous macules with underlying dyspigmentation - Recommend daily broad spectrum sunscreen SPF 30+ to sun-exposed areas, reapply every 2 hours as needed.  - Call for new or changing lesions.   Lentigines - Scattered tan macules - Discussed due to sun exposure - Benign, observe - Call for any changes  Return in about 6 months (around 04/20/2020).   I, , CMA, am acting as scribe for 06/18/2020, MD .  Documentation: I have reviewed the above documentation for accuracy and completeness, and I agree with the above.  Joanie Coddington, MD

## 2020-02-17 ENCOUNTER — Other Ambulatory Visit: Payer: Self-pay | Admitting: Dermatology

## 2020-04-26 ENCOUNTER — Other Ambulatory Visit: Payer: Self-pay

## 2020-04-26 ENCOUNTER — Ambulatory Visit: Payer: BC Managed Care – PPO | Admitting: Dermatology

## 2020-04-26 DIAGNOSIS — L700 Acne vulgaris: Secondary | ICD-10-CM | POA: Diagnosis not present

## 2020-04-26 MED ORDER — SPIRONOLACTONE 25 MG PO TABS: 50.0000 mg | ORAL_TABLET | Freq: Every day | ORAL | 5 refills | Status: DC

## 2020-04-26 NOTE — Progress Notes (Signed)
   Follow-Up Visit   Subjective  Tamara Simmons is a 55 y.o. female who presents for the following: Acne (Face, 60m f/u Spironolactone 25mg  2 po qd, Aczone 7.5% gel bid, gets occasional bump).  The following portions of the chart were reviewed this encounter and updated as appropriate:   Tobacco  Allergies  Meds  Problems  Med Hx  Surg Hx  Fam Hx     Review of Systems:  No other skin or systemic complaints except as noted in HPI or Assessment and Plan.  Objective  Well appearing patient in no apparent distress; mood and affect are within normal limits.  A focused examination was performed including face. Relevant physical exam findings are noted in the Assessment and Plan.  Objective  face: Pink macule L cheek   Assessment & Plan  Acne vulgaris face Chronic, persistent Improved on current regimen.  She would like to continue current regimen.  She has no side effects and is happy with her current treatment.  BP today 94/63 Pt will get a copy of labs next time she has them done (to check potassium).  Cont Spironolactone 25mg  2 po qd Cont Aczone 7.5% gel bid  spironolactone (ALDACTONE) 25 MG tablet - face  Return in about 6 months (around 10/24/2020) for Acne f/u.   I, , RMA, am acting as scribe for 12/24/2020, MD .  Documentation: I have reviewed the above documentation for accuracy and completeness, and I agree with the above.  Ardis Rowan, MD

## 2020-04-29 ENCOUNTER — Encounter: Payer: Self-pay | Admitting: Dermatology

## 2020-06-19 ENCOUNTER — Other Ambulatory Visit: Payer: Self-pay | Admitting: Dermatology

## 2020-10-29 ENCOUNTER — Ambulatory Visit: Payer: BC Managed Care – PPO | Admitting: Dermatology

## 2020-10-30 ENCOUNTER — Other Ambulatory Visit: Payer: Self-pay

## 2020-10-30 DIAGNOSIS — L700 Acne vulgaris: Secondary | ICD-10-CM

## 2020-10-30 MED ORDER — DAPSONE 7.5 % EX GEL: 1.0000 "application " | Freq: Two times a day (BID) | CUTANEOUS | 5 refills | Status: DC

## 2020-12-20 ENCOUNTER — Other Ambulatory Visit: Payer: Self-pay | Admitting: Dermatology

## 2020-12-20 DIAGNOSIS — L700 Acne vulgaris: Secondary | ICD-10-CM

## 2020-12-31 ENCOUNTER — Other Ambulatory Visit: Payer: Self-pay

## 2020-12-31 DIAGNOSIS — L700 Acne vulgaris: Secondary | ICD-10-CM

## 2020-12-31 MED ORDER — SPIRONOLACTONE 25 MG PO TABS: 50.0000 mg | ORAL_TABLET | Freq: Every day | ORAL | 0 refills | Status: DC

## 2021-01-22 ENCOUNTER — Ambulatory Visit: Payer: BC Managed Care – PPO | Admitting: Dermatology

## 2021-01-22 ENCOUNTER — Other Ambulatory Visit: Payer: Self-pay

## 2021-01-22 ENCOUNTER — Encounter: Payer: Self-pay | Admitting: Dermatology

## 2021-01-22 DIAGNOSIS — L700 Acne vulgaris: Secondary | ICD-10-CM | POA: Diagnosis not present

## 2021-01-22 MED ORDER — SPIRONOLACTONE 25 MG PO TABS: 50.0000 mg | ORAL_TABLET | Freq: Every day | ORAL | 5 refills | Status: DC

## 2021-01-22 NOTE — Patient Instructions (Signed)

## 2021-01-22 NOTE — Progress Notes (Signed)
   Follow-Up Visit   Subjective  Tamara Simmons is a 55 y.o. female who presents for the following: Acne (9 month follow up - doing very well - Aczone 7.5% gel bid, Spironolactone 25 mg 2 po qd - not having any side effects from the spironolactone).  The following portions of the chart were reviewed this encounter and updated as appropriate:   Tobacco  Allergies  Meds  Problems  Med Hx  Surg Hx  Fam Hx     Review of Systems:  No other skin or systemic complaints except as noted in HPI or Assessment and Plan.  Objective  Well appearing patient in no apparent distress; mood and affect are within normal limits.  A focused examination was performed including face. Relevant physical exam findings are noted in the Assessment and Plan.  Face Clear today   Assessment & Plan  Acne vulgaris Face  Well controlled.  Discussed starting Winlevi cream and tapering off of Spironolactone. Patient prefers to continue treatment as she is doing now. Sample of Winlevi given today and if she decides to switch, we will send it in for her.  Reviewed labs from 05/2019 and potassium was WNL. BP today - 122/64 Pulse - 66  Continue Spironolactone 25 mg 2 po qd, Aczone 7.5% gel bid  spironolactone (ALDACTONE) 25 MG tablet - Face Take 2 tablets (50 mg total) by mouth daily.  Related Medications Dapsone (ACZONE) 7.5 % GEL Apply 1 application topically in the morning and at bedtime.  Return in about 6 months (around 07/22/2021) for Acne.  I, Joanie Coddington, CMA, am acting as scribe for Armida Sans, MD . Documentation: I have reviewed the above documentation for accuracy and completeness, and I agree with the above.  Armida Sans, MD

## 2021-03-29 DIAGNOSIS — S66911A Strain of unspecified muscle, fascia and tendon at wrist and hand level, right hand, initial encounter: Secondary | ICD-10-CM | POA: Diagnosis not present

## 2021-07-09 DIAGNOSIS — S66911A Strain of unspecified muscle, fascia and tendon at wrist and hand level, right hand, initial encounter: Secondary | ICD-10-CM | POA: Diagnosis not present

## 2021-07-24 ENCOUNTER — Ambulatory Visit: Payer: BC Managed Care – PPO | Admitting: Dermatology

## 2021-07-24 VITALS — BP 115/64 | HR 70

## 2021-07-24 DIAGNOSIS — L700 Acne vulgaris: Secondary | ICD-10-CM

## 2021-07-24 DIAGNOSIS — Z79899 Other long term (current) drug therapy: Secondary | ICD-10-CM | POA: Diagnosis not present

## 2021-07-24 MED ORDER — SPIRONOLACTONE 25 MG PO TABS: 50.0000 mg | ORAL_TABLET | Freq: Every day | ORAL | 5 refills | Status: DC

## 2021-07-24 MED ORDER — DAPSONE 7.5 % EX GEL: 1.0000 "application " | Freq: Two times a day (BID) | CUTANEOUS | 5 refills | Status: DC

## 2021-07-24 NOTE — Patient Instructions (Signed)
Spironolactone can cause increased urination and cause blood pressure to decrease. Please watch for signs of lightheadedness and be cautious when changing position. It can sometimes cause breast tenderness or an irregular period in premenopausal women. It can also increase potassium. The increase in potassium usually is not a concern unless you are taking other medicines that also increase potassium, so please be sure your doctor knows all of the other medications you are taking. This medication should not be taken by pregnant women.  This medicine should also not be taken together with sulfa drugs like Bactrim (trimethoprim/sulfamethexazole).  ° °If You Need Anything After Your Visit ° °If you have any questions or concerns for your doctor, please call our main line at [phone] and press option 4 to reach your doctor's medical assistant. If no one answers, please leave a voicemail as directed and we will return your call as soon as possible. Messages left after 4 pm will be answered the following business day.  ° °You may also send us a message via MyChart. We typically respond to MyChart messages within 1-2 business days. ° °For prescription refills, please ask your pharmacy to contact our office. Our fax number is [phone]. ° °If you have an urgent issue when the clinic is closed that cannot wait until the next business day, you can page your doctor at the number below.   ° °Please note that while we do our best to be available for urgent issues outside of office hours, we are not available 24/7.  ° °If you have an urgent issue and are unable to reach us, you may choose to seek medical care at your doctor's office, retail clinic, urgent care center, or emergency room. ° °If you have a medical emergency, please immediately call 911 or go to the emergency department. ° °Pager Numbers ° °- Dr. Kowalski: [phone] ° °- Dr. Moye: [phone] ° °- Dr. Stewart: [phone] ° °In the event of inclement  weather, please call our main line at [phone] for an update on the status of any delays or closures. ° °Dermatology Medication Tips: °Please keep the boxes that topical medications come in in order to help keep track of the instructions about where and how to use these. Pharmacies typically print the medication instructions only on the boxes and not directly on the medication tubes.  ° °If your medication is too expensive, please contact our office at [phone] option 4 or send us a message through MyChart.  ° °We are unable to tell what your co-pay for medications will be in advance as this is different depending on your insurance coverage. However, we may be able to find a substitute medication at lower cost or fill out paperwork to get insurance to cover a needed medication.  ° °If a prior authorization is required to get your medication covered by your insurance company, please allow us 1-2 business days to complete this process. ° °Drug prices often vary depending on where the prescription is filled and some pharmacies may offer cheaper prices. ° °The website www.goodrx.com contains coupons for medications through different pharmacies. The prices here do not account for what the cost may be with help from insurance (it may be cheaper with your insurance), but the website can give you the price if you did not use any insurance.  °- You can print the associated coupon and take it with your prescription to the pharmacy.  °- You may also stop by our office during regular business hours and   pick up a GoodRx coupon card.  °- If you need your prescription sent electronically to a different pharmacy, notify our office through [facility] MyChart or by phone at [phone] option 4. ° ° ° ° °Si Usted Necesita Algo Después de Su Visita ° °También puede enviarnos un mensaje a través de MyChart. Por lo general respondemos a los mensajes de MyChart en el transcurso de 1 a 2 días hábiles. ° °Para renovar recetas,  por favor pida a su farmacia que se ponga en contacto con nuestra oficina. Nuestro número de fax es el [phone]. ° °Si tiene un asunto urgente cuando la clínica esté cerrada y que no puede esperar hasta el siguiente día hábil, puede llamar/localizar a su doctor(a) al número que aparece a continuación.  ° °Por favor, tenga en cuenta que aunque hacemos todo lo posible para estar disponibles para asuntos urgentes fuera del horario de oficina, no estamos disponibles las 24 horas del día, los 7 días de la semana.  ° °Si tiene un problema urgente y no puede comunicarse con nosotros, puede optar por buscar atención médica  en el consultorio de su doctor(a), en una clínica privada, en un centro de atención urgente o en una sala de emergencias. ° °Si tiene una emergencia médica, por favor llame inmediatamente al 911 o vaya a la sala de emergencias. ° °Números de bíper ° °- Dr. Kowalski: [phone] ° °- Dra. Moye: [phone] ° °- Dra. Stewart: [phone] ° °En caso de inclemencias del tiempo, por favor llame a nuestra línea principal al [phone] para una actualización sobre el estado de cualquier retraso o cierre. ° °Consejos para la medicación en dermatología: °Por favor, guarde las cajas en las que vienen los medicamentos de uso tópico para ayudarle a seguir las instrucciones sobre dónde y cómo usarlos. Las farmacias generalmente imprimen las instrucciones del medicamento sólo en las cajas y no directamente en los tubos del medicamento.  ° °Si su medicamento es muy caro, por favor, póngase en contacto con nuestra oficina llamando al [phone] y presione la opción 4 o envíenos un mensaje a través de MyChart.  ° °No podemos decirle cuál será su copago por los medicamentos por adelantado ya que esto es diferente dependiendo de la cobertura de su seguro. Sin embargo, es posible que podamos encontrar un medicamento sustituto a menor costo o llenar un formulario para que el seguro cubra el medicamento que se  considera necesario.  ° °Si se requiere una autorización previa para que su compañía de seguros cubra su medicamento, por favor permítanos de 1 a 2 días hábiles para completar este proceso. ° °Los precios de los medicamentos varían con frecuencia dependiendo del lugar de dónde se surte la receta y alguna farmacias pueden ofrecer precios más baratos. ° °El sitio web www.goodrx.com tiene cupones para medicamentos de diferentes farmacias. Los precios aquí no tienen en cuenta lo que podría costar con la ayuda del seguro (puede ser más barato con su seguro), pero el sitio web puede darle el precio si no utilizó ningún seguro.  °- Puede imprimir el cupón correspondiente y llevarlo con su receta a la farmacia.  °- También puede pasar por nuestra oficina durante el horario de atención regular y recoger una tarjeta de cupones de GoodRx.  °- Si necesita que su receta se envíe electrónicamente a una farmacia diferente, informe a nuestra oficina a través de MyChart de [facility] o por teléfono llamando al [phone] y presione la opción 4. ° °

## 2021-07-24 NOTE — Progress Notes (Signed)
   Follow-Up Visit   Subjective  Tamara Simmons is a 56 y.o. female who presents for the following: Acne (Spironolactone 25 mg 2 po QD and Aczone 7.5% gel BID. Patient is please with results and would like to continue the current treatment).  The following portions of the chart were reviewed this encounter and updated as appropriate:   Tobacco  Allergies  Meds  Problems  Med Hx  Surg Hx  Fam Hx     Review of Systems:  No other skin or systemic complaints except as noted in HPI or Assessment and Plan.  Objective  Well appearing patient in no apparent distress; mood and affect are within normal limits.  A focused examination was performed including the face. Relevant physical exam findings are noted in the Assessment and Plan.  Face Clear.   Assessment & Plan  Acne vulgaris Face  Well controlled on current medications - post menopausal x 3 years  Continue Aczone 7.5% gel BID.   Continue spironolactone orally patient is interested in decreasing dose to 1 tab po QD to see how it goes, but we will send in Spironolactone 25mg  2 po QD in case she starts to flare on QD. Spironolactone can cause increased urination and cause blood pressure to decrease. Please watch for signs of lightheadedness and be cautious when changing position. It can sometimes cause breast tenderness or an irregular period in premenopausal women. It can also increase potassium. The increase in potassium usually is not a concern unless you are taking other medicines that also increase potassium, so please be sure your doctor knows all of the other medications you are taking. This medication should not be taken by pregnant women.  This medicine should also not be taken together with sulfa drugs like Bactrim (trimethoprim/sulfamethexazole).    BP 115/64  Heart rate 70 BPM   Long term medication management.  Patient is using long term (months to years) prescription medication  to control their dermatologic  condition.  These medications require periodic monitoring to evaluate for efficacy and side effects and may require periodic laboratory monitoring.  Related Medications Dapsone (ACZONE) 7.5 % GEL Apply 1 application. topically in the morning and at bedtime.  spironolactone (ALDACTONE) 25 MG tablet Take 2 tablets (50 mg total) by mouth daily.  Return in about 6 months (around 01/24/2022) for acne follow up , TBSE.  Luther Redo, CMA, am acting as scribe for Sarina Ser, MD . Documentation: I have reviewed the above documentation for accuracy and completeness, and I agree with the above.  Sarina Ser, MD

## 2021-07-27 IMAGING — MG DIGITAL SCREENING BILAT W/ TOMO W/ CAD
8 series · 8 of 24 positions shown · non-contrast
Comparison: Previous exam(s).

CLINICAL DATA: Screening.

EXAM:
DIGITAL SCREENING BILATERAL MAMMOGRAM WITH TOMO AND CAD

[L CC synth-2D]
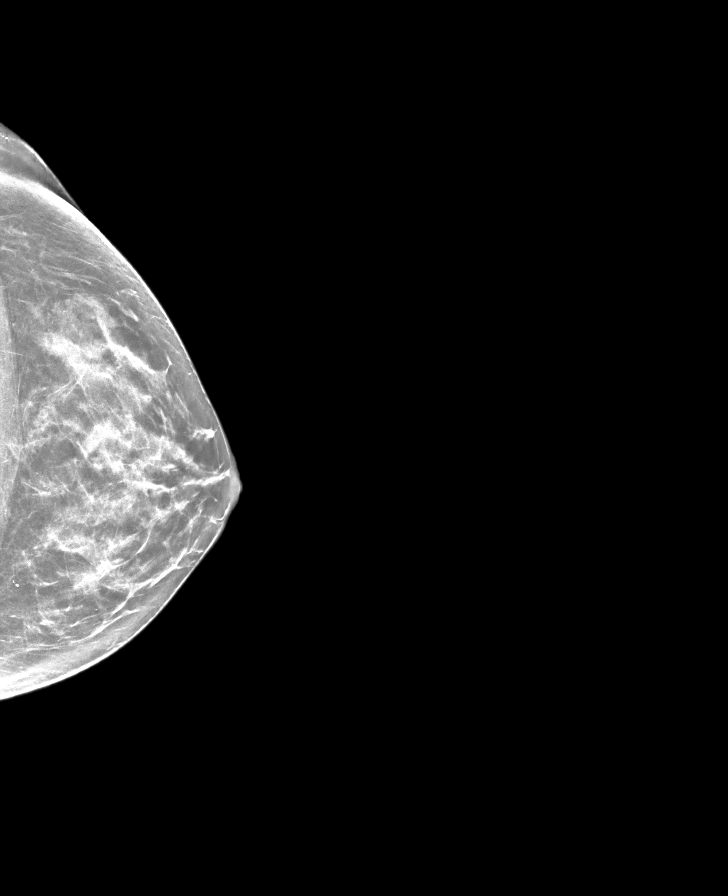

[R CC synth-2D]
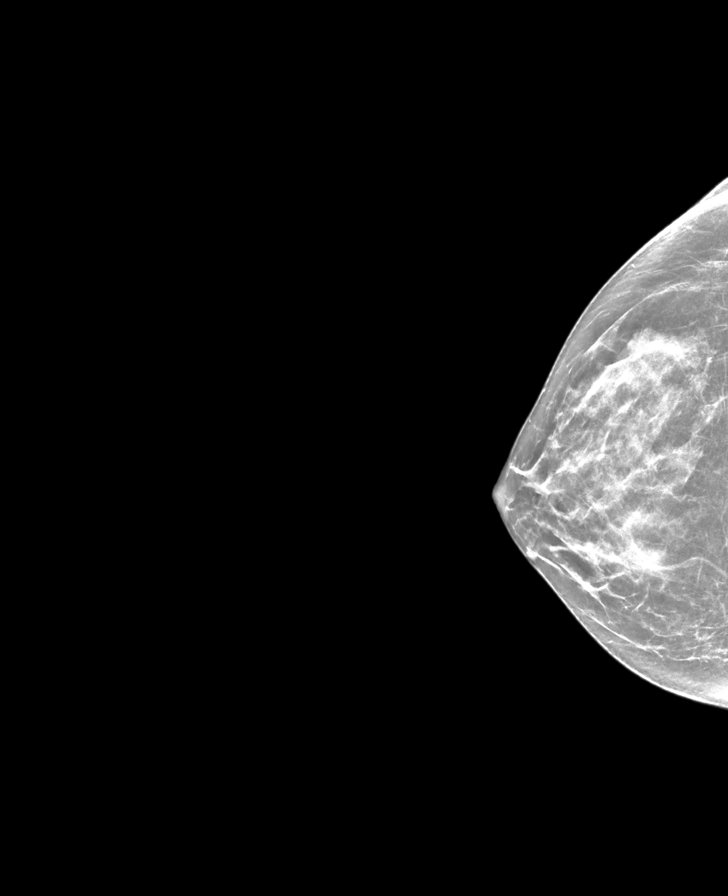

[L MLO synth-2D]
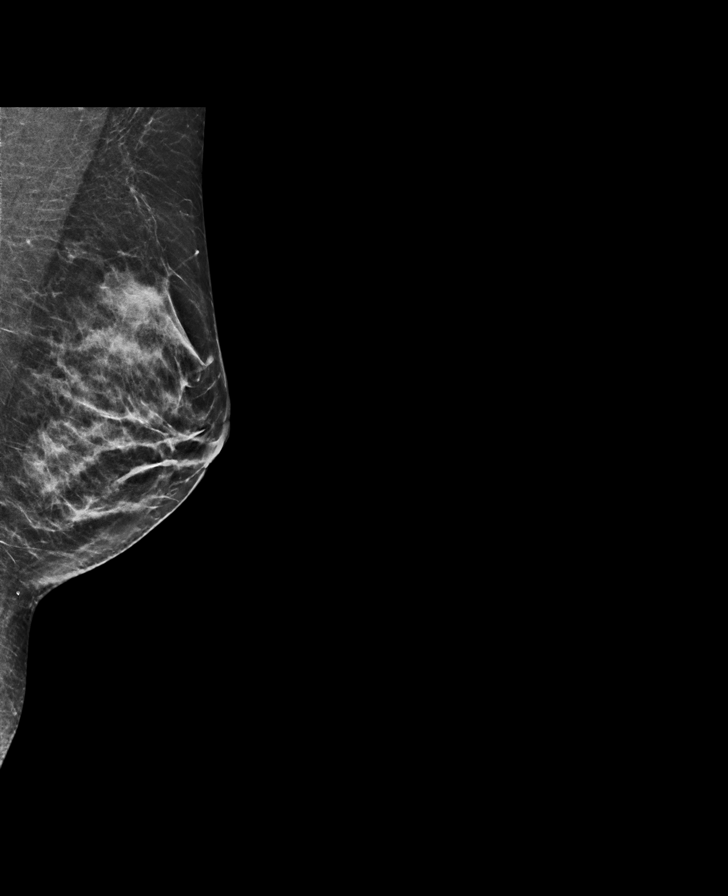

[R MLO synth-2D]
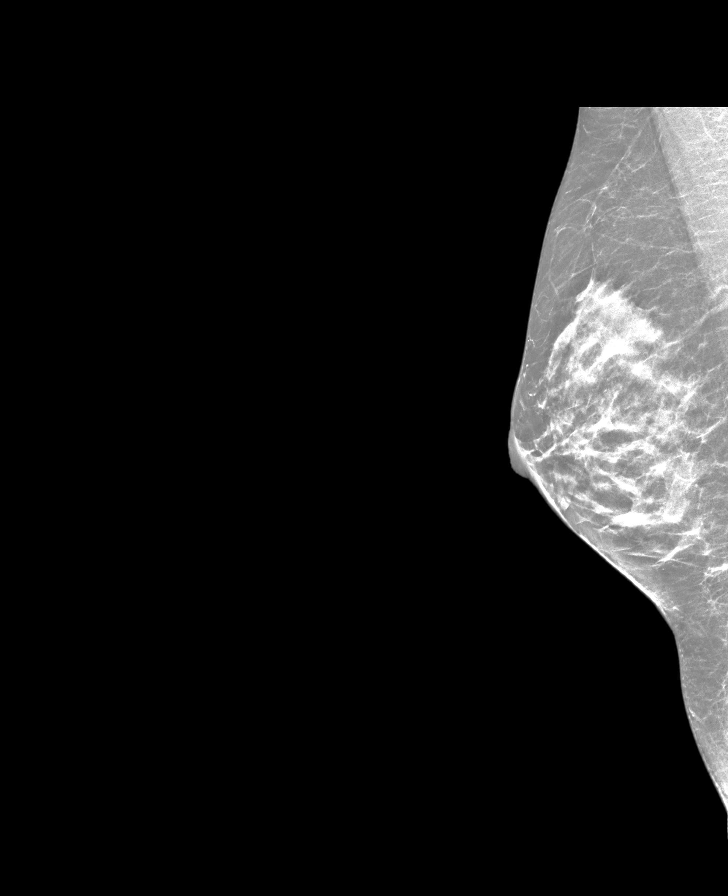

[R MLO tomo · tomo slice 29/56.0]
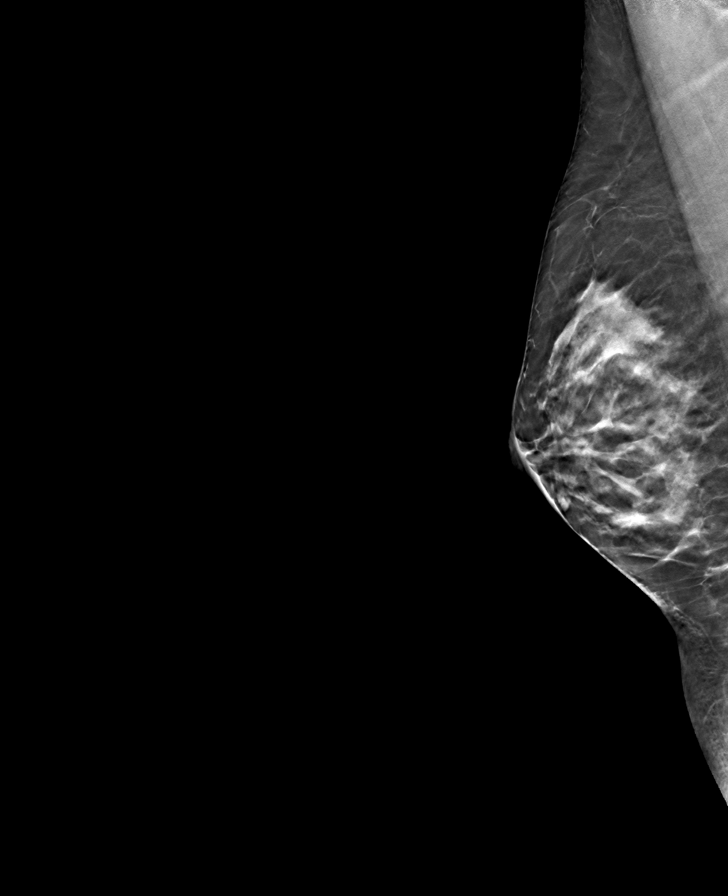

[L MLO tomo · tomo slice 28/55.0]
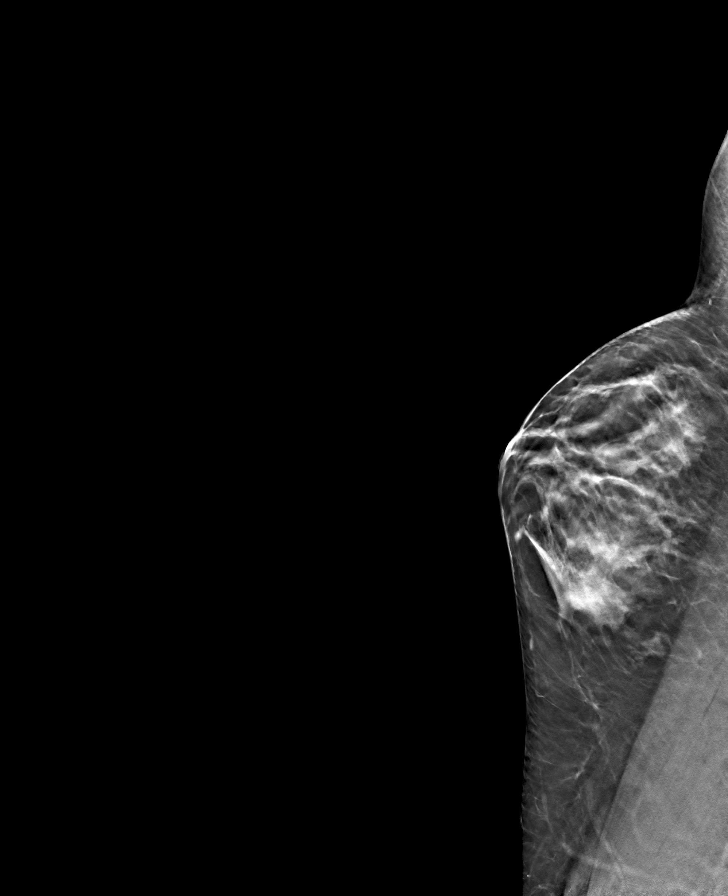

[R CC tomo · tomo slice 29/57.0]
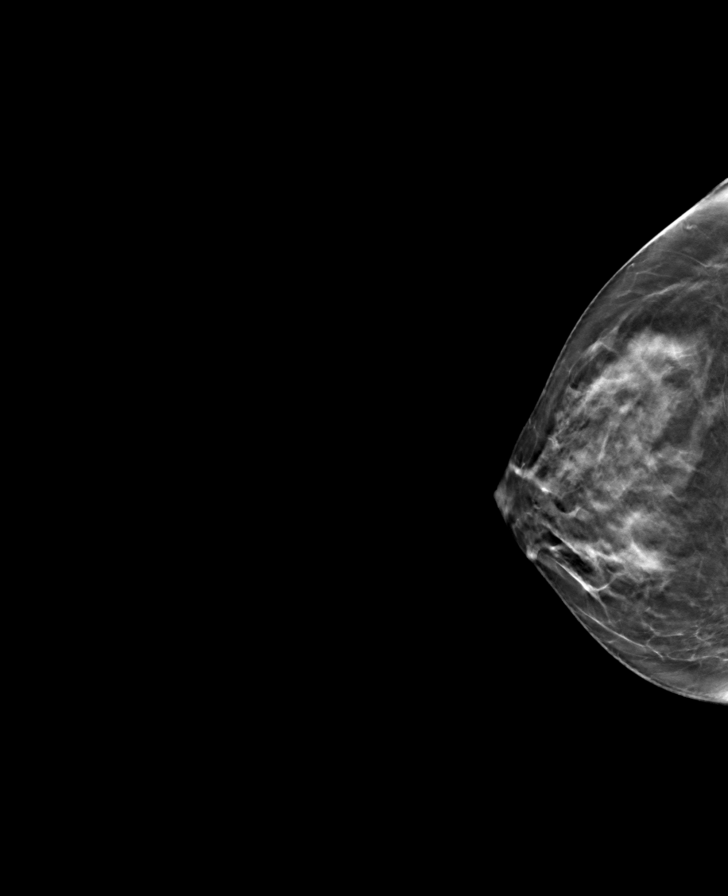

[L CC tomo · tomo slice 31/60.0]
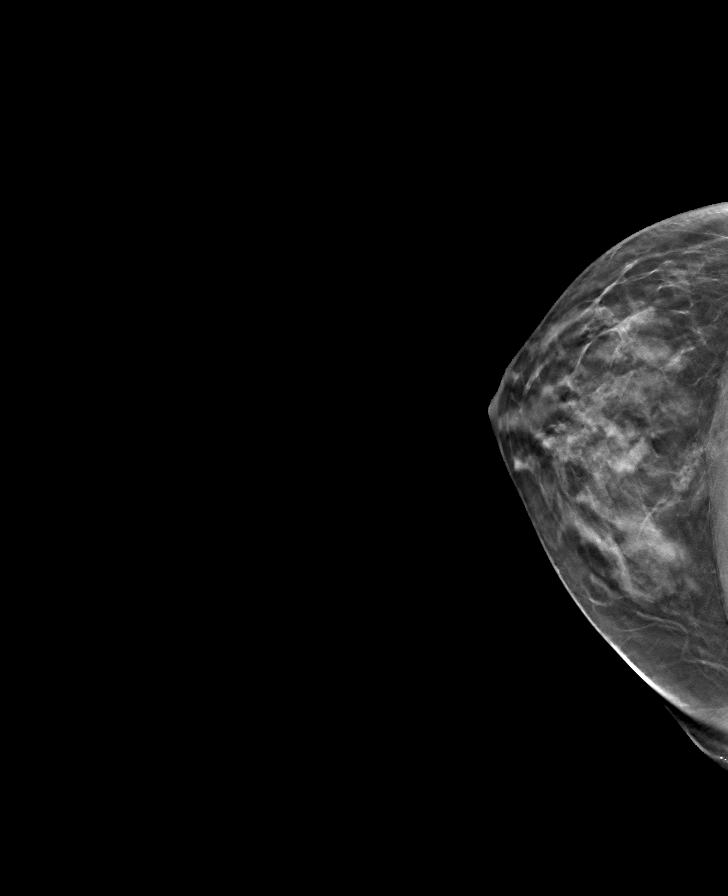

[8 of 24 positions shown; findings below may reference images not displayed]

ACR Breast Density Category c: The breast tissue is heterogeneously
dense, which may obscure small masses.
FINDINGS: There are no findings suspicious for malignancy. Images were
processed with CAD.
IMPRESSION: No mammographic evidence of malignancy. A result letter of this
screening mammogram will be mailed directly to the patient.

RECOMMENDATION:
Screening mammogram in one year. (Code:[id number]-U-LHB)

BI-RADS CATEGORY  1: Negative.

## 2021-08-09 ENCOUNTER — Encounter: Payer: Self-pay | Admitting: Dermatology

## 2021-10-14 DIAGNOSIS — S66911A Strain of unspecified muscle, fascia and tendon at wrist and hand level, right hand, initial encounter: Secondary | ICD-10-CM | POA: Diagnosis not present

## 2022-01-27 ENCOUNTER — Ambulatory Visit: Payer: BC Managed Care – PPO | Admitting: Dermatology

## 2022-01-27 DIAGNOSIS — Z79899 Other long term (current) drug therapy: Secondary | ICD-10-CM

## 2022-01-27 DIAGNOSIS — L700 Acne vulgaris: Secondary | ICD-10-CM

## 2022-01-27 MED ORDER — SPIRONOLACTONE 25 MG PO TABS: 25.0000 mg | ORAL_TABLET | Freq: Two times a day (BID) | ORAL | 5 refills | Status: DC

## 2022-01-27 MED ORDER — SPIRONOLACTONE 25 MG PO TABS: 50.0000 mg | ORAL_TABLET | Freq: Every day | ORAL | 5 refills | Status: DC

## 2022-01-27 MED ORDER — DAPSONE 7.5 % EX GEL: 1.0000 "application " | Freq: Two times a day (BID) | CUTANEOUS | 5 refills | Status: DC

## 2022-01-27 NOTE — Progress Notes (Signed)
   Follow-Up Visit   Subjective  Tamara Simmons is a 56 y.o. female who presents for the following: Acne (6 month follow up - Spironolactone 25 mg 1 po qd, Aczone 7.5% gel bid - Well controlled).  The following portions of the chart were reviewed this encounter and updated as appropriate:   Tobacco  Allergies  Meds  Problems  Med Hx  Surg Hx  Fam Hx     Review of Systems:  No other skin or systemic complaints except as noted in HPI or Assessment and Plan.  Objective  Well appearing patient in no apparent distress; mood and affect are within normal limits.  A focused examination was performed including face. Relevant physical exam findings are noted in the Assessment and Plan.  Face Clear today.  B/P  80/65   Assessment & Plan  Acne vulgaris Face Well controlled -  Discussed Winlevi.  Continue:  Spironoloactone 25 mg 2 po qd,  Aczone 7.5% gel bid  spironolactone (ALDACTONE) 25 MG tablet - Face Take 1 tablet (25 mg total) by mouth 2 (two) times daily. Dapsone (ACZONE) 7.5 % GEL - Face Apply 1 application  topically 2 (two) times daily.  Return in about 6 months (around 07/28/2022) for Acne.  I, Joanie Coddington, CMA, am acting as scribe for Armida Sans, MD . Documentation: I have reviewed the above documentation for accuracy and completeness, and I agree with the above.  Armida Sans, MD

## 2022-01-27 NOTE — Patient Instructions (Addendum)
Spironolactone can cause increased urination and cause blood pressure to decrease. Please watch for signs of lightheadedness and be cautious when changing position. It can sometimes cause breast tenderness or an irregular period in premenopausal women. It can also increase potassium. The increase in potassium usually is not a concern unless you are taking other medicines that also increase potassium, so please be sure your doctor knows all of the other medications you are taking. This medication should not be taken by pregnant women.  This medicine should also not be taken together with sulfa drugs like Bactrim (trimethoprim/sulfamethexazole).      Due to recent changes in healthcare laws, you may see results of your pathology and/or laboratory studies on MyChart before the doctors have had a chance to review them. We understand that in some cases there may be results that are confusing or concerning to you. Please understand that not all results are received at the same time and often the doctors may need to interpret multiple results in order to provide you with the best plan of care or course of treatment. Therefore, we ask that you please give us 2 business days to thoroughly review all your results before contacting the office for clarification. Should we see a critical lab result, you will be contacted sooner.   If You Need Anything After Your Visit  If you have any questions or concerns for your doctor, please call our main line at [phone] and press option 4 to reach your doctor's medical assistant. If no one answers, please leave a voicemail as directed and we will return your call as soon as possible. Messages left after 4 pm will be answered the following business day.   You may also send us a message via MyChart. We typically respond to MyChart messages within 1-2 business days.  For prescription refills, please ask your pharmacy to contact our office. Our fax number is  [phone].  If you have an urgent issue when the clinic is closed that cannot wait until the next business day, you can page your doctor at the number below.    Please note that while we do our best to be available for urgent issues outside of office hours, we are not available 24/7.   If you have an urgent issue and are unable to reach us, you may choose to seek medical care at your doctor's office, retail clinic, urgent care center, or emergency room.  If you have a medical emergency, please immediately call 911 or go to the emergency department.  Pager Numbers  - Dr. Kowalski: [phone]  - Dr. Moye: [phone]  - Dr. Stewart: [phone]  In the event of inclement weather, please call our main line at [phone] for an update on the status of any delays or closures.  Dermatology Medication Tips: Please keep the boxes that topical medications come in in order to help keep track of the instructions about where and how to use these. Pharmacies typically print the medication instructions only on the boxes and not directly on the medication tubes.   If your medication is too expensive, please contact our office at [phone] option 4 or send us a message through MyChart.   We are unable to tell what your co-pay for medications will be in advance as this is different depending on your insurance coverage. However, we may be able to find a substitute medication at lower cost or fill out paperwork to get insurance to cover a needed medication.   If   a prior authorization is required to get your medication covered by your insurance company, please allow us 1-2 business days to complete this process.  Drug prices often vary depending on where the prescription is filled and some pharmacies may offer cheaper prices.  The website www.goodrx.com contains coupons for medications through different pharmacies. The prices here do not account for what the cost may be with help from  insurance (it may be cheaper with your insurance), but the website can give you the price if you did not use any insurance.  - You can print the associated coupon and take it with your prescription to the pharmacy.  - You may also stop by our office during regular business hours and pick up a GoodRx coupon card.  - If you need your prescription sent electronically to a different pharmacy, notify our office through [facility] MyChart or by phone at [phone] option 4.     Si Usted Necesita Algo Despus de Su Visita  Tambin puede enviarnos un mensaje a travs de MyChart. Por lo general respondemos a los mensajes de MyChart en el transcurso de 1 a 2 das hbiles.  Para renovar recetas, por favor pida a su farmacia que se ponga en contacto con nuestra oficina. Nuestro nmero de fax es el [phone].  Si tiene un asunto urgente cuando la clnica est cerrada y que no puede esperar hasta el siguiente da hbil, puede llamar/localizar a su doctor(a) al nmero que aparece a continuacin.   Por favor, tenga en cuenta que aunque hacemos todo lo posible para estar disponibles para asuntos urgentes fuera del horario de oficina, no estamos disponibles las 24 horas del da, los 7 das de la semana.   Si tiene un problema urgente y no puede comunicarse con nosotros, puede optar por buscar atencin mdica  en el consultorio de su doctor(a), en una clnica privada, en un centro de atencin urgente o en una sala de emergencias.  Si tiene una emergencia mdica, por favor llame inmediatamente al 911 o vaya a la sala de emergencias.  Nmeros de bper  - Dr. Kowalski: [phone]  - Dra. Moye: [phone]  - Dra. Stewart: [phone]  En caso de inclemencias del tiempo, por favor llame a nuestra lnea principal al [phone] para una actualizacin sobre el estado de cualquier retraso o cierre.  Consejos para la medicacin en dermatologa: Por favor, guarde las cajas en las que vienen los  medicamentos de uso tpico para ayudarle a seguir las instrucciones sobre dnde y cmo usarlos. Las farmacias generalmente imprimen las instrucciones del medicamento slo en las cajas y no directamente en los tubos del medicamento.   Si su medicamento es muy caro, por favor, pngase en contacto con nuestra oficina llamando al [phone] y presione la opcin 4 o envenos un mensaje a travs de MyChart.   No podemos decirle cul ser su copago por los medicamentos por adelantado ya que esto es diferente dependiendo de la cobertura de su seguro. Sin embargo, es posible que podamos encontrar un medicamento sustituto a menor costo o llenar un formulario para que el seguro cubra el medicamento que se considera necesario.   Si se requiere una autorizacin previa para que su compaa de seguros cubra su medicamento, por favor permtanos de 1 a 2 das hbiles para completar este proceso.  Los precios de los medicamentos varan con frecuencia dependiendo del lugar de dnde se surte la receta y alguna farmacias pueden ofrecer precios ms baratos.  El sitio web www.goodrx.com tiene   cupones para medicamentos de diferentes farmacias. Los precios aqu no tienen en cuenta lo que podra costar con la ayuda del seguro (puede ser ms barato con su seguro), pero el sitio web puede darle el precio si no utiliz ningn seguro.  - Puede imprimir el cupn correspondiente y llevarlo con su receta a la farmacia.  - Tambin puede pasar por nuestra oficina durante el horario de atencin regular y recoger una tarjeta de cupones de GoodRx.  - Si necesita que su receta se enve electrnicamente a una farmacia diferente, informe a nuestra oficina a travs de MyChart de [place] o por telfono llamando al [phone] y presione la opcin 4.  

## 2022-01-28 DIAGNOSIS — N3001 Acute cystitis with hematuria: Secondary | ICD-10-CM | POA: Diagnosis not present

## 2022-01-28 DIAGNOSIS — R300 Dysuria: Secondary | ICD-10-CM | POA: Diagnosis not present

## 2022-02-01 ENCOUNTER — Encounter: Payer: Self-pay | Admitting: Dermatology

## 2022-02-19 ENCOUNTER — Other Ambulatory Visit: Payer: Self-pay | Admitting: Dermatology

## 2022-02-19 DIAGNOSIS — L700 Acne vulgaris: Secondary | ICD-10-CM

## 2022-06-17 DIAGNOSIS — M9902 Segmental and somatic dysfunction of thoracic region: Secondary | ICD-10-CM | POA: Diagnosis not present

## 2022-06-17 DIAGNOSIS — M542 Cervicalgia: Secondary | ICD-10-CM | POA: Diagnosis not present

## 2022-06-17 DIAGNOSIS — M9901 Segmental and somatic dysfunction of cervical region: Secondary | ICD-10-CM | POA: Diagnosis not present

## 2022-06-17 DIAGNOSIS — R519 Headache, unspecified: Secondary | ICD-10-CM | POA: Diagnosis not present

## 2022-07-22 DIAGNOSIS — H01001 Unspecified blepharitis right upper eyelid: Secondary | ICD-10-CM | POA: Diagnosis not present

## 2022-07-22 DIAGNOSIS — H00024 Hordeolum internum left upper eyelid: Secondary | ICD-10-CM | POA: Diagnosis not present

## 2022-07-22 DIAGNOSIS — H01004 Unspecified blepharitis left upper eyelid: Secondary | ICD-10-CM | POA: Diagnosis not present

## 2022-07-30 ENCOUNTER — Ambulatory Visit: Payer: BC Managed Care – PPO | Admitting: Dermatology

## 2022-07-30 ENCOUNTER — Encounter: Payer: Self-pay | Admitting: Dermatology

## 2022-07-30 VITALS — BP 104/60 | HR 75

## 2022-07-30 DIAGNOSIS — L820 Inflamed seborrheic keratosis: Secondary | ICD-10-CM

## 2022-07-30 DIAGNOSIS — Z79899 Other long term (current) drug therapy: Secondary | ICD-10-CM

## 2022-07-30 DIAGNOSIS — L738 Other specified follicular disorders: Secondary | ICD-10-CM

## 2022-07-30 DIAGNOSIS — L700 Acne vulgaris: Secondary | ICD-10-CM

## 2022-07-30 MED ORDER — DAPSONE 7.5 % EX GEL: 1.0000 "application " | Freq: Two times a day (BID) | CUTANEOUS | 5 refills | Status: DC

## 2022-07-30 MED ORDER — SPIRONOLACTONE 25 MG PO TABS: 25.0000 mg | ORAL_TABLET | Freq: Once | ORAL | 3 refills | Status: DC

## 2022-07-30 NOTE — Progress Notes (Signed)
   Follow-Up Visit   Subjective  Tamara Simmons is a 57 y.o. female who presents for the following: Acne Vulgaris. Face. 6 month follow up. Taking Spironolactone 25 mg daily, reduced herself from 50 mg daily. Using Aczone 7.5% gel as directed. States acne is well controlled and could possibly stop Spironolactone.   The following portions of the chart were reviewed this encounter and updated as appropriate: medications, allergies, medical history  Review of Systems:  No other skin or systemic complaints except as noted in HPI or Assessment and Plan.  Objective  Well appearing patient in no apparent distress; mood and affect are within normal limits.  Areas Examined: Face, chest and back  Relevant exam findings are noted in the Assessment and Plan. mid forehead x1 Erythematous keratotic or waxy stuck-on papule or plaque.    Assessment & Plan   Inflamed seborrheic keratosis mid forehead x1  Symptomatic, irritating, patient would like treated.  Destruction of lesion - mid forehead x1 Complexity: simple   Destruction method: cryotherapy   Informed consent: discussed and consent obtained   Timeout:  patient name, date of birth, surgical site, and procedure verified Lesion destroyed using liquid nitrogen: Yes   Region frozen until ice ball extended beyond lesion: Yes   Outcome: patient tolerated procedure well with no complications   Post-procedure details: wound care instructions given   Additional details:  Prior to procedure, discussed risks of blister formation, small wound, skin dyspigmentation, or rare scar following cryotherapy. Recommend Vaseline ointment to treated areas while healing.   Acne vulgaris  Related Medications Dapsone (ACZONE) 7.5 % GEL Apply 1 application  topically 2 (two) times daily.  spironolactone (ALDACTONE) 25 MG tablet Take 1 tablet (25 mg total) by mouth once for 1 dose.   ACNE VULGARIS Exam: clear today  Chronic condition with duration  or expected duration over one year. Currently well-controlled.   Treatment Plan: Continue Spironolactone 25 mg once a day Continue Aczone 7.5% gel twice daily.  Spironolactone can cause increased urination and cause blood pressure to decrease. Please watch for signs of lightheadedness and be cautious when changing position. It can sometimes cause breast tenderness or an irregular period in premenopausal women. It can also increase potassium. The increase in potassium usually is not a concern unless you are taking other medicines that also increase potassium, so please be sure your doctor knows all of the other medications you are taking. This medication should not be taken by pregnant women.  This medicine should also not be taken together with sulfa drugs like Bactrim (trimethoprim/sulfamethexazole).    Sebaceous Hyperplasia - Small yellow papules with a central dell - Benign-appearing - Observe. Call for changes.    Return in about 1 year (around 07/30/2023) for Acne Follow Up.  I, Lawson Radar, CMA, am acting as scribe for Armida Sans, MD.   Documentation: I have reviewed the above documentation for accuracy and completeness, and I agree with the above.  Armida Sans, MD

## 2022-07-30 NOTE — Patient Instructions (Addendum)
Continue Spironolactone 25 mg once daily  Continue Aczone 7.5% gel as directed.   Cryotherapy Aftercare  Wash gently with soap and water everyday.   Apply Vaseline Jelly daily until healed.   Spironolactone can cause increased urination and cause blood pressure to decrease. Please watch for signs of lightheadedness and be cautious when changing position. It can sometimes cause breast tenderness or an irregular period in premenopausal women. It can also increase potassium. The increase in potassium usually is not a concern unless you are taking other medicines that also increase potassium, so please be sure your doctor knows all of the other medications you are taking. This medication should not be taken by pregnant women.  This medicine should also not be taken together with sulfa drugs like Bactrim (trimethoprim/sulfamethexazole).     Due to recent changes in healthcare laws, you may see results of your pathology and/or laboratory studies on MyChart before the doctors have had a chance to review them. We understand that in some cases there may be results that are confusing or concerning to you. Please understand that not all results are received at the same time and often the doctors may need to interpret multiple results in order to provide you with the best plan of care or course of treatment. Therefore, we ask that you please give [place] 2 business days to thoroughly review all your results before contacting the office for clarification. Should we see a critical lab result, you will be contacted sooner.   If You Need Anything After Your Visit  If you have any questions or concerns for your doctor, please call our main line at [phone] and press option 4 to reach your doctor's medical assistant. If no one answers, please leave a voicemail as directed and we will return your call as soon as possible. Messages left after 4 pm will be answered the following business day.   You may also send [place] a  message via MyChart. We typically respond to MyChart messages within 1-2 business days.  For prescription refills, please ask your pharmacy to contact our office. Our fax number is [phone].  If you have an urgent issue when the clinic is closed that cannot wait until the next business day, you can page your doctor at the number below.    Please note that while we do our best to be available for urgent issues outside of office hours, we are not available 24/7.   If you have an urgent issue and are unable to reach [place], you may choose to seek medical care at your doctor's office, retail clinic, urgent care center, or emergency room.  If you have a medical emergency, please immediately call 911 or go to the emergency department.  Pager Numbers  - Dr. Gwen Pounds: [phone]  - Dr. Neale Burly: [phone]  - Dr. Roseanne Reno: [phone]  In the event of inclement weather, please call our main line at [phone] for an update on the status of any delays or closures.  Dermatology Medication Tips: Please keep the boxes that topical medications come in in order to help keep track of the instructions about where and how to use these. Pharmacies typically print the medication instructions only on the boxes and not directly on the medication tubes.   If your medication is too expensive, please contact our office at [phone] option 4 or send [place] a message through MyChart.   We are unable to tell what your co-pay for medications will be in advance as this is  different depending on your insurance coverage. However, we may be able to find a substitute medication at lower cost or fill out paperwork to get insurance to cover a needed medication.   If a prior authorization is required to get your medication covered by your insurance company, please allow [place] 1-2 business days to complete this process.  Drug prices often vary depending on where the prescription is filled and some pharmacies may offer  cheaper prices.  The website www.goodrx.com contains coupons for medications through different pharmacies. The prices here do not account for what the cost may be with help from insurance (it may be cheaper with your insurance), but the website can give you the price if you did not use any insurance.  - You can print the associated coupon and take it with your prescription to the pharmacy.  - You may also stop by our office during regular business hours and pick up a GoodRx coupon card.  - If you need your prescription sent electronically to a different pharmacy, notify our office through [facility] or by phone at [phone] option 4.     Si Usted Necesita Algo Despus de Su Visita  Tambin puede enviarnos un mensaje a travs de Clinical cytogeneticist. Por lo general respondemos a los mensajes de MyChart en el transcurso de 1 a 2 das hbiles.  Para renovar recetas, por favor pida a su farmacia que se ponga en contacto con nuestra oficina. Annie Sable de fax es [place] [phone].  Si tiene un asunto urgente cuando la clnica est cerrada y que no puede esperar hasta el siguiente da hbil, puede llamar/localizar a su doctor(a) al nmero que aparece a continuacin.   Por favor, tenga en cuenta que aunque hacemos todo lo posible para estar disponibles para asuntos urgentes fuera del horario de [place], no estamos disponibles las 24 horas del da, los 7 [address]  [address] de la [place].   Si tiene un problema urgente y no puede comunicarse con nosotros, puede optar por buscar atencin mdica  en el consultorio de su doctor(a), en una clnica privada, en un centro de atencin urgente o en una sala de emergencias.  Si tiene Engineer, drilling, por favor llame inmediatamente al 911 o vaya a la sala de emergencias.  Nmeros de bper  - Dr. Gwen Pounds: [phone]  - Dra. Moye: [phone]  - Dra. Roseanne Reno: [phone]  En caso de inclemencias del [place], por favor llame a Lacy Duverney principal al  [phone] para una actualizacin sobre el [place] de cualquier retraso o cierre.  Consejos para la medicacin en dermatologa: Por favor, guarde las cajas en las que vienen los medicamentos de uso tpico para ayudarle a seguir las instrucciones sobre dnde y cmo usarlos. Las farmacias generalmente imprimen las instrucciones del medicamento slo en las cajas y no directamente en los tubos del [place].   Si su medicamento es muy caro, por favor, pngase en contacto con Rolm Gala llamando al [phone] y presione la opcin 4 o envenos un mensaje a travs de Clinical cytogeneticist.   No podemos decirle cul ser su copago por los medicamentos por adelantado ya que esto es diferente dependiendo de la cobertura de su seguro. Sin embargo, es posible que podamos encontrar un medicamento sustituto a Audiological scientist un formulario para que el seguro cubra el medicamento que se considera necesario.   Si se requiere una autorizacin previa para que su compaa de seguros [place] su medicamento, por favor permtanos de 1 a 2 das hbiles para completar  este proceso.  Los precios de los medicamentos varan con frecuencia dependiendo del Environmental consultant de dnde se surte la receta y alguna farmacias pueden ofrecer precios ms baratos.  El sitio web www.goodrx.com tiene cupones para medicamentos de Airline pilot. Los precios aqu no tienen en cuenta lo que podra costar con la ayuda del seguro (puede ser ms barato con su seguro), pero el sitio web puede darle el precio si no utiliz Research scientist (physical sciences).  - Puede imprimir el cupn correspondiente y llevarlo con su receta a la farmacia.  - Tambin puede pasar por nuestra oficina durante el horario de atencin regular y Charity fundraiser una tarjeta de cupones de GoodRx.  - Si necesita que su receta se enve electrnicamente a una farmacia diferente, informe a nuestra oficina a travs de MyChart de [place] o por telfono llamando al [phone] y presione la opcin 4.

## 2022-08-09 ENCOUNTER — Other Ambulatory Visit: Payer: Self-pay | Admitting: Dermatology

## 2022-08-09 DIAGNOSIS — L700 Acne vulgaris: Secondary | ICD-10-CM

## 2022-08-13 ENCOUNTER — Encounter: Payer: Self-pay | Admitting: Dermatology

## 2022-08-14 ENCOUNTER — Other Ambulatory Visit: Payer: Self-pay

## 2022-08-14 ENCOUNTER — Telehealth: Payer: Self-pay

## 2022-08-14 DIAGNOSIS — L700 Acne vulgaris: Secondary | ICD-10-CM

## 2022-08-14 MED ORDER — DAPSONE 7.5 % EX GEL: 1.0000 "application " | Freq: Two times a day (BID) | CUTANEOUS | 5 refills | Status: DC

## 2022-08-14 NOTE — Telephone Encounter (Signed)
Patient's Dapsone Gel was denied by [employer]. Patient's insurance wants generic Duac tried and failed first OR formulary options include: Clindamycin Gel/Lotion, Metronidazole Gel or Azelaic Acid Gel.

## 2022-08-14 NOTE — Telephone Encounter (Signed)
Spoke with patient and she would prefer non covered cost of Dapsone through [employer] aw

## 2022-11-04 ENCOUNTER — Encounter: Payer: Self-pay | Admitting: Nurse Practitioner

## 2022-11-04 ENCOUNTER — Ambulatory Visit: Payer: BC Managed Care – PPO | Admitting: Nurse Practitioner

## 2022-11-04 VITALS — BP 110/80 | HR 75 | Temp 97.7°F | Resp 16 | Ht 63.0 in | Wt 131.0 lb

## 2022-11-04 DIAGNOSIS — E782 Mixed hyperlipidemia: Secondary | ICD-10-CM

## 2022-11-04 DIAGNOSIS — Z1231 Encounter for screening mammogram for malignant neoplasm of breast: Secondary | ICD-10-CM | POA: Diagnosis not present

## 2022-11-04 DIAGNOSIS — L659 Nonscarring hair loss, unspecified: Secondary | ICD-10-CM

## 2022-11-04 DIAGNOSIS — E538 Deficiency of other specified B group vitamins: Secondary | ICD-10-CM | POA: Diagnosis not present

## 2022-11-04 DIAGNOSIS — Z119 Encounter for screening for infectious and parasitic diseases, unspecified: Secondary | ICD-10-CM

## 2022-11-04 DIAGNOSIS — Z833 Family history of diabetes mellitus: Secondary | ICD-10-CM

## 2022-11-04 DIAGNOSIS — E559 Vitamin D deficiency, unspecified: Secondary | ICD-10-CM | POA: Diagnosis not present

## 2022-11-04 NOTE — Progress Notes (Signed)
[facility] [address] [place], [place] [address]  Internal MEDICINE  Office Visit Note  Patient Name: Tamara Simmons  [id number]  [id number]  Date of Service: 11/04/2022   Complaints/HPI Pt is here for establishment of PCP. Chief Complaint  Patient presents with   New Patient (Initial Visit)    Requesting labs   Depression    Sometimes has mood swings, some good days, some bad days   Referral    OB/GYN    HPI Tamara Simmons presents for a new patient visit to establish care.  Well-appearing 57 y.o. female with no significant medical problems. Constipation.  Work: Architectural technologist  Home: lives with family  Diet: reasonably healthy Exercise: walkings, pilates, weight lifting, strength trainaing, yoga.  Tobacco use: none  Alcohol use: 1-2 times per week, wine  Illicit drug use: thc 1-2 x per month, gummies.  Routine CRC screening: done 3-4 years ago Routine mammogram: due now  Pap smear: due now  Labs: due for routine labs  New or worsening pain: none, but has chronic neck pain, seen by chriopractor which has not been very beneficial.  Fam hx afib, depression and diabetes     Current Medication: Outpatient Encounter Medications as of 11/04/2022  Medication Sig Note   Cranberry-Vit C-Lactobacillus ([facility]CRANBERRY) 450-30 MG TABS Take by mouth. 04/02/2015: Received from: [facility]   Dapsone (ACZONE) 7.5 % GEL Apply 1 application  topically 2 (two) times daily.    Multiple Vitamin (MULTI-VITAMINS) TABS Take by mouth. 04/02/2015: Received from: [facility]   spironolactone (ALDACTONE) 25 MG tablet TAKE 2 TABLETS BY MOUTH EVERY DAY    ST JOHNS WORT PO Take by mouth.    No facility-administered encounter medications on file as of 11/04/2022.    Surgical History: Past Surgical History:  Procedure Laterality Date   CESAREAN SECTION     TUBAL LIGATION      Medical History: Past Medical History:   Diagnosis Date   ASCUS with positive high risk HPV cervical    HSV-2 infection    Recurrent UTI     Family History: Family History  Problem Relation Age of Onset   Diabetes Mother    Lung cancer Mother    Depression Father    Prostate cancer Father    Diabetes Daughter    Depression Daughter    Bladder Cancer Neg Hx    Kidney cancer Neg Hx     Social History   Socioeconomic History   Marital status: Married    Spouse name: Not on file   Number of children: Not on file   Years of education: Not on file   Highest education level: Not on file  Occupational History   Not on file  Tobacco Use   Smoking status: Never   Smokeless tobacco: Never  Substance and Sexual Activity   Alcohol use: Yes    Alcohol/week: 0.0 standard drinks of alcohol   Drug use: Yes    Types: Marijuana    Comment: Couple times monthly   Sexual activity: Yes  Other Topics Concern   Not on file  Social History Narrative   Not on file   Social Determinants of Health   Financial Resource Strain: Not on file  Food Insecurity: Not on file  Transportation Needs: Not on file  Physical Activity: Not on file  Stress: Not on file  Social Connections: Not on file  Intimate Partner Violence: Not on file     Review  of Systems  Constitutional:  Negative for chills, fatigue and unexpected weight change.  HENT:  Negative for congestion, postnasal drip, rhinorrhea, sneezing and sore throat.   Eyes:  Negative for redness.  Respiratory:  Negative for cough, chest tightness and shortness of breath.   Cardiovascular:  Negative for chest pain and palpitations.  Gastrointestinal:  Negative for abdominal pain, constipation, diarrhea, nausea and vomiting.  Genitourinary:  Negative for dysuria and frequency.  Musculoskeletal:  Negative for arthralgias, back pain, joint swelling and neck pain.  Skin:  Negative for rash.  Neurological: Negative.  Negative for tremors and numbness.  Hematological:  Negative for  adenopathy. Does not bruise/bleed easily.  Psychiatric/Behavioral:  Positive for depression. Negative for behavioral problems (Depression), sleep disturbance and suicidal ideas. The patient is not nervous/anxious.     Vital Signs: BP 110/80   Pulse 75   Temp 97.7 F (36.5 C)   Resp 16   Ht 5\' 3"  (1.6 m)   Wt 131 lb (59.4 kg)   SpO2 99%   BMI 23.21 kg/m    Physical Exam Vitals reviewed.  Constitutional:      General: She is not in acute distress.    Appearance: Normal appearance. She is normal weight. She is not ill-appearing.  HENT:     Head: Normocephalic and atraumatic.  Eyes:     Pupils: Pupils are equal, round, and reactive to light.  Cardiovascular:     Rate and Rhythm: Normal rate and regular rhythm.  Pulmonary:     Effort: Pulmonary effort is normal. No respiratory distress.  Neurological:     Mental Status: She is alert and oriented to person, place, and time.  Psychiatric:        Mood and Affect: Mood normal.        Behavior: Behavior normal.       Assessment/Plan: 1. Hair loss Routine labs ordered - CBC with Differential/Platelet - CMP14+EGFR - Lipid Profile - Vitamin D (25 hydroxy) - B12 and Folate Panel - Hgb A1C w/o eAG - TSH + free T4 - Interpretation:  2. Mixed hyperlipidemia Routine labs ordered  - CBC with Differential/Platelet - CMP14+EGFR - Lipid Profile - Vitamin D (25 hydroxy) - B12 and Folate Panel - Hgb A1C w/o eAG - TSH + free T4 - Interpretation:  3. B12 deficiency Routine labs ordered  - CBC with Differential/Platelet - B12 and Folate Panel - TSH + free T4 - Interpretation:  4. Vitamin D deficiency Routine lab ordered  - Vitamin D (25 hydroxy) - Interpretation:  5. Encounter for screening mammogram for malignant neoplasm of breast Screening mammogram ordered  - MM 3D SCREENING MAMMOGRAM BILATERAL BREAST; Future  6. Screening examination for infectious disease STI lab ordered  - STI Profile  7. Family history  of diabetes mellitus Routine labs ordered  - CBC with Differential/Platelet - CMP14+EGFR - Lipid Profile - Vitamin D (25 hydroxy) - B12 and Folate Panel - Hgb A1C w/o eAG - TSH + free T4 - Interpretation:    General Counseling: Tamara Simmons verbalizes understanding of the findings of todays visit and agrees with plan of treatment. I have discussed any further diagnostic evaluation that may be needed or ordered today. We also reviewed her medications today. she has been encouraged to call the office with any questions or concerns that should arise related to todays visit.    Orders Placed This Encounter  Procedures   MM 3D SCREENING MAMMOGRAM BILATERAL BREAST   STI Profile   CBC with Differential/Platelet  CMP14+EGFR   Lipid Profile   Vitamin D (25 hydroxy)   B12 and Folate Panel   Hgb A1C w/o eAG   TSH + free T4    No orders of the defined types were placed in this encounter.   Return for CPE/PAP, Montrail Mehrer PCP at earliest available opening, have labs done prior to visit. .  Time spent:30 Minutes Time spent with patient included reviewing progress notes, labs, imaging studies, and discussing plan for follow up.   [place] Controlled Substance Database was reviewed by me for overdose risk score (ORS)   This patient was seen by Sallyanne Kuster, FNP-C in collaboration with Dr. Beverely Risen as a part of collaborative care agreement.   Kinley Ferrentino R. Tedd Sias, MSN, FNP-C Internal Medicine

## 2022-11-05 DIAGNOSIS — L659 Nonscarring hair loss, unspecified: Secondary | ICD-10-CM | POA: Diagnosis not present

## 2022-11-05 DIAGNOSIS — E559 Vitamin D deficiency, unspecified: Secondary | ICD-10-CM | POA: Diagnosis not present

## 2022-11-05 DIAGNOSIS — Z119 Encounter for screening for infectious and parasitic diseases, unspecified: Secondary | ICD-10-CM | POA: Diagnosis not present

## 2022-11-05 DIAGNOSIS — E538 Deficiency of other specified B group vitamins: Secondary | ICD-10-CM | POA: Diagnosis not present

## 2022-11-05 DIAGNOSIS — E782 Mixed hyperlipidemia: Secondary | ICD-10-CM | POA: Diagnosis not present

## 2022-11-06 LAB — B12 AND FOLATE PANEL
Folate: 19.2 ng/mL (ref >3.0)
Vitamin B-12: 656 pg/mL (ref 232–1245)

## 2022-11-06 LAB — STI PROFILE
HCV Ab: NONREACTIVE
HIV Screen 4th Generation wRfx: NONREACTIVE
Hep B Core Total Ab: NEGATIVE
Hep B Surface Ab, Qual: NONREACTIVE
Hepatitis B Surface Ag: NEGATIVE
RPR Ser Ql: NONREACTIVE

## 2022-11-06 LAB — CMP14+EGFR
ALT: 17 IU/L (ref 0–32)
AST: 23 IU/L (ref 0–40)
Albumin: 4.3 g/dL (ref 3.8–4.9)
Alkaline Phosphatase: 83 IU/L (ref 44–121)
BUN/Creatinine Ratio: 16 (ref 9–23)
BUN: 13 mg/dL (ref 6–24)
Bilirubin Total: 0.3 mg/dL (ref 0.0–1.2)
CO2: 21 mmol/L (ref 20–29)
Calcium: 9.2 mg/dL (ref 8.7–10.2)
Chloride: 104 mmol/L (ref 96–106)
Creatinine, Ser: 0.8 mg/dL (ref 0.57–1.00)
Globulin, Total: 2.5 g/dL (ref 1.5–4.5)
Glucose: 91 mg/dL (ref 70–99)
Potassium: 4.4 mmol/L (ref 3.5–5.2)
Sodium: 143 mmol/L (ref 134–144)
Total Protein: 6.8 g/dL (ref 6.0–8.5)
eGFR: 86 mL/min/{1.73_m2} (ref >59)

## 2022-11-06 LAB — CBC WITH DIFFERENTIAL/PLATELET
Basophils Absolute: 0 10*3/uL (ref 0.0–0.2)
Basos: 1 %
EOS (ABSOLUTE): 0.1 10*3/uL (ref 0.0–0.4)
Eos: 1 %
Hematocrit: 40 % (ref 34.0–46.6)
Hemoglobin: 13.6 g/dL (ref 11.1–15.9)
Immature Grans (Abs): 0 10*3/uL (ref 0.0–0.1)
Immature Granulocytes: 0 %
Lymphocytes Absolute: 1.4 10*3/uL (ref 0.7–3.1)
Lymphs: 31 %
MCH: 32.1 pg (ref 26.6–33.0)
MCHC: 34 g/dL (ref 31.5–35.7)
MCV: 94 fL (ref 79–97)
Monocytes Absolute: 0.4 10*3/uL (ref 0.1–0.9)
Monocytes: 10 %
Neutrophils Absolute: 2.5 10*3/uL (ref 1.4–7.0)
Neutrophils: 57 %
Platelets: 329 10*3/uL (ref 150–450)
RBC: 4.24 x10E6/uL (ref 3.77–5.28)
RDW: 12.3 % (ref 11.7–15.4)
WBC: 4.4 10*3/uL (ref 3.4–10.8)

## 2022-11-06 LAB — HCV INTERPRETATION

## 2022-11-06 LAB — LIPID PANEL
Chol/HDL Ratio: 2.9 ratio (ref 0.0–4.4)
Cholesterol, Total: 208 mg/dL — ABNORMAL HIGH (ref 100–199)
HDL: 72 mg/dL (ref >39)
LDL Chol Calc (NIH): 121 mg/dL — ABNORMAL HIGH (ref 0–99)
Triglycerides: 87 mg/dL (ref 0–149)
VLDL Cholesterol Cal: 15 mg/dL (ref 5–40)

## 2022-11-06 LAB — HGB A1C W/O EAG: Hgb A1c MFr Bld: 5.7 % — ABNORMAL HIGH (ref 4.8–5.6)

## 2022-11-06 LAB — VITAMIN D 25 HYDROXY (VIT D DEFICIENCY, FRACTURES): Vit D, 25-Hydroxy: 24.5 ng/mL — ABNORMAL LOW (ref 30.0–100.0)

## 2022-11-06 LAB — TSH+FREE T4
Free T4: 1.09 ng/dL (ref 0.82–1.77)
TSH: 1.99 u[IU]/mL (ref 0.450–4.500)

## 2022-11-18 ENCOUNTER — Ambulatory Visit
Admission: RE | Admit: 2022-11-18 | Discharge: 2022-11-18 | Disposition: A | Discharge status: Home / Self Care | Payer: BC Managed Care – PPO | Source: Ambulatory Visit | Attending: Nurse Practitioner | Admitting: Nurse Practitioner

## 2022-11-18 DIAGNOSIS — Z1231 Encounter for screening mammogram for malignant neoplasm of breast: Secondary | ICD-10-CM | POA: Insufficient documentation

## 2022-11-24 ENCOUNTER — Encounter: Payer: Self-pay | Admitting: Nurse Practitioner

## 2022-11-24 ENCOUNTER — Ambulatory Visit (INDEPENDENT_AMBULATORY_CARE_PROVIDER_SITE_OTHER): Payer: BC Managed Care – PPO | Admitting: Nurse Practitioner

## 2022-11-24 VITALS — BP 120/74 | HR 78 | Temp 98.1°F | Resp 16 | Ht 63.0 in | Wt 131.4 lb

## 2022-11-24 DIAGNOSIS — E559 Vitamin D deficiency, unspecified: Secondary | ICD-10-CM

## 2022-11-24 DIAGNOSIS — E782 Mixed hyperlipidemia: Secondary | ICD-10-CM | POA: Diagnosis not present

## 2022-11-24 DIAGNOSIS — R61 Generalized hyperhidrosis: Secondary | ICD-10-CM | POA: Diagnosis not present

## 2022-11-24 DIAGNOSIS — Z0001 Encounter for general adult medical examination with abnormal findings: Secondary | ICD-10-CM

## 2022-11-24 DIAGNOSIS — R7303 Prediabetes: Secondary | ICD-10-CM | POA: Diagnosis not present

## 2022-11-24 DIAGNOSIS — R300 Dysuria: Secondary | ICD-10-CM | POA: Diagnosis not present

## 2022-11-24 DIAGNOSIS — Z124 Encounter for screening for malignant neoplasm of cervix: Secondary | ICD-10-CM | POA: Diagnosis not present

## 2022-11-24 DIAGNOSIS — Z23 Encounter for immunization: Secondary | ICD-10-CM

## 2022-11-24 MED ORDER — HEPATITIS B VAC RECOMBINANT 20 MCG/ML IJ SUSP: 1.0000 mL | Freq: Once | INTRAMUSCULAR | 2 refills | Status: AC | PRN

## 2022-11-24 MED ORDER — CONJ ESTROG-MEDROXYPROGEST ACE 0.3-1.5 MG PO TABS: 1.0000 | ORAL_TABLET | Freq: Every day | ORAL | 3 refills | Status: DC

## 2022-11-24 NOTE — Progress Notes (Signed)
[facility] [address] [place], [place] [address]  Internal MEDICINE  Office Visit Note  Patient Name: Tamara Simmons  [id number]  [id number]  Date of Service: 11/24/2022  Chief Complaint  Patient presents with   Annual Exam    HPI Tamara Simmons presents for an annual well visit and physical exam.  Well-appearing 57 y.o. female with no significant medication conditions.  Routine CRC screening: done 3-4 years ago.  Routine mammogram: done this year .  Pap smear: done today.  Labs: lab results discussed with patient  -- slightly low vitamin D level of 24.5 -- A1c is prediabetic at 5.7 -- LDL is slightly elevated at 121, HDL is great at 72, total cholesterol is 208.  New or worsening pain: none  Night sweats x 2-3 months now. Went through menopause several years ago.     Current Medication: Outpatient Encounter Medications as of 11/24/2022  Medication Sig Note   Cranberry-Vit C-Lactobacillus ([facility]AN[facility]) 450-30 MG TABS Take by mouth. 04/02/2015: Received from: [facility]   Dapsone (ACZONE) 7.5 % GEL Apply 1 application  topically 2 (two) times daily.    estrogen, conjugated,-medroxyprogesterone (PREMPRO) 0.3-1.5 MG tablet Take 1 tablet by mouth daily.    hepatitis B vaccine (ENGERIX-B) 20 MCG/ML injection Inject 1 mL (20 mcg total) into the muscle once as needed for up to 1 dose.    Multiple Vitamin (MULTI-VITAMINS) TABS Take by mouth. 04/02/2015: Received from: [facility]   spironolactone (ALDACTONE) 25 MG tablet TAKE 2 TABLETS BY MOUTH EVERY DAY    ST JOHNS WORT PO Take by mouth.    No facility-administered encounter medications on file as of 11/24/2022.    Surgical History: Past Surgical History:  Procedure Laterality Date   CESAREAN SECTION     TUBAL LIGATION      Medical History: Past Medical History:  Diagnosis Date   ASCUS with positive high risk HPV cervical    HSV-2 infection    Recurrent UTI      Family History: Family History  Problem Relation Age of Onset   Diabetes Mother    Lung cancer Mother    Depression Father    Prostate cancer Father    Diabetes Daughter    Depression Daughter    Bladder Cancer Neg Hx    Kidney cancer Neg Hx    Breast cancer Neg Hx     Social History   Socioeconomic History   Marital status: Married    Spouse name: Not on file   Number of children: Not on file   Years of education: Not on file   Highest education level: Not on file  Occupational History   Not on file  Tobacco Use   Smoking status: Never   Smokeless tobacco: Never  Substance and Sexual Activity   Alcohol use: Yes    Alcohol/week: 0.0 standard drinks of alcohol   Drug use: Yes    Types: Marijuana    Comment: Couple times monthly   Sexual activity: Yes  Other Topics Concern   Not on file  Social History Narrative   Not on file   Social Determinants of Health   Financial Resource Strain: Not on file  Food Insecurity: Not on file  Transportation Needs: Not on file  Physical Activity: Not on file  Stress: Not on file  Social Connections: Not on file  Intimate Partner Violence: Not on file      Review of Systems  Constitutional:  Negative  for activity change, appetite change, chills, fatigue, fever and unexpected weight change.  HENT: Negative.  Negative for congestion, ear pain, postnasal drip, rhinorrhea, sneezing, sore throat and trouble swallowing.   Eyes: Negative.  Negative for redness.  Respiratory: Negative.  Negative for cough, chest tightness, shortness of breath and wheezing.   Cardiovascular: Negative.  Negative for chest pain and palpitations.  Gastrointestinal: Negative.  Negative for abdominal pain, blood in stool, constipation, diarrhea, nausea and vomiting.  Endocrine: Negative.   Genitourinary: Negative.  Negative for difficulty urinating, dysuria, frequency, hematuria and urgency.  Musculoskeletal: Negative.  Negative for arthralgias,  back pain, joint swelling, myalgias and neck pain.  Skin: Negative.  Negative for rash and wound.  Allergic/Immunologic: Negative.  Negative for immunocompromised state.  Neurological: Negative.  Negative for dizziness, tremors, seizures, numbness and headaches.  Hematological: Negative.  Negative for adenopathy. Does not bruise/bleed easily.  Psychiatric/Behavioral: Negative.  Negative for behavioral problems (Depression), self-injury, sleep disturbance and suicidal ideas. The patient is not nervous/anxious.     Vital Signs: BP 120/74   Pulse 78   Temp 98.1 F (36.7 C)   Resp 16   Ht 5\' 3"  (1.6 m)   Wt 131 lb 6.4 oz (59.6 kg)   LMP 03/18/2015 Comment: BTL  SpO2 99%   BMI 23.28 kg/m    Physical Exam Constitutional:      General: She is awake.     Appearance: Normal appearance. She is well-developed, well-groomed and normal weight. She is not ill-appearing, toxic-appearing or diaphoretic.  HENT:     Head: Normocephalic and atraumatic.     Right Ear: Tympanic membrane, ear canal and external ear normal.     Left Ear: Tympanic membrane, ear canal and external ear normal.     Nose: Nose normal. No congestion or rhinorrhea.     Mouth/Throat:     Mouth: Mucous membranes are moist.     Pharynx: Oropharynx is clear. No oropharyngeal exudate or posterior oropharyngeal erythema.  Eyes:     Extraocular Movements: Extraocular movements intact.     Conjunctiva/sclera: Conjunctivae normal.     Pupils: Pupils are equal, round, and reactive to light.  Cardiovascular:     Rate and Rhythm: Normal rate and regular rhythm.     Heart sounds: Normal heart sounds. No murmur heard. Pulmonary:     Effort: Pulmonary effort is normal. No respiratory distress.     Breath sounds: Normal breath sounds. No wheezing.  Abdominal:     Hernia: There is no hernia in the left inguinal area or right inguinal area.  Genitourinary:    General: Normal vulva.     Exam position: Lithotomy position.     Pubic  Area: No rash or pubic lice.      Labia:        Right: No rash, tenderness, lesion or injury.        Left: No rash, tenderness, lesion or injury.      Urethra: No prolapse, urethral pain or urethral swelling.     Vagina: Normal. No signs of injury and foreign body. No vaginal discharge, erythema, tenderness, bleeding, lesions or prolapsed vaginal walls.     Cervix: Normal. No cervical motion tenderness, discharge, friability, lesion, erythema, cervical bleeding or eversion.     Uterus: Normal. Not deviated, not enlarged, not fixed, not tender and no uterine prolapse.      Adnexa: Right adnexa normal and left adnexa normal.       Right: No mass, tenderness or fullness.  Left: No mass, tenderness or fullness.       Rectum: Normal. No mass, anal fissure or external hemorrhoid.  Lymphadenopathy:     Lower Body: No right inguinal adenopathy. No left inguinal adenopathy.  Neurological:     Mental Status: She is alert and oriented to person, place, and time.     Cranial Nerves: No cranial nerve deficit.     Coordination: Coordination normal.     Gait: Gait normal.  Psychiatric:        Behavior: Behavior is cooperative.        Assessment/Plan: 1. Encounter for routine adult health examination with abnormal findings Age-appropriate preventive screenings and vaccinations discussed, annual physical exam completed. Routine labs for health maintenance results discussed with patient today. PHM updated.   2. Prediabetes  A1c is 5.7, monitor carbs, no medication prescribed.   3. Night sweats Prempro prescribed.   4. Mixed hyperlipidemia Encouraged fish oil supplement.   5. Vitamin D deficiency Recommend OTC vitamin D supplement.   6. Dysuria Routine urinalysis done  - Urinalysis, Routine w reflex microscopic  7. Routine Papanicolaou smear Routine pelvic exam with pap smear, no abnormal findings.  - IGP, Aptima HPV  8. Need for hepatitis B booster vaccination - hepatitis B  vaccine (ENGERIX-B) 20 MCG/ML injection; Inject 1 mL (20 mcg total) into the muscle once as needed for up to 1 dose.  Dispense: 1 mL; Refill: 2        General Counseling: Tamara Simmons verbalizes understanding of the findings of todays visit and agrees with plan of treatment. I have discussed any further diagnostic evaluation that may be needed or ordered today. We also reviewed her medications today. she has been encouraged to call the office with any questions or concerns that should arise related to todays visit.    Orders Placed This Encounter  Procedures   Urinalysis, Routine w reflex microscopic    Meds ordered this encounter  Medications   hepatitis B vaccine (ENGERIX-B) 20 MCG/ML injection    Sig: Inject 1 mL (20 mcg total) into the muscle once as needed for up to 1 dose.    Dispense:  1 mL    Refill:  2    Patient has a non-reactive hep B surface antibody, needs to take hep B vacc series please.   estrogen, conjugated,-medroxyprogesterone (PREMPRO) 0.3-1.5 MG tablet    Sig: Take 1 tablet by mouth daily.    Dispense:  30 tablet    Refill:  3    New script, please fill.    Return in about 3 months (around 02/23/2023) for F/U, Weight loss, Exilda Wilhite PCP.   Total time spent:30 Minutes Time spent includes review of chart, medications, test results, and follow up plan with the patient.   [place] Controlled Substance Database was reviewed by me.  This patient was seen by Sallyanne Kuster, FNP-C in collaboration with Dr. Beverely Risen as a part of collaborative care agreement.  Malanie Koloski R. Tedd Sias, MSN, FNP-C Internal medicine

## 2022-11-25 LAB — URINALYSIS, ROUTINE W REFLEX MICROSCOPIC
Bilirubin, UA: NEGATIVE
Glucose, UA: NEGATIVE
Ketones, UA: NEGATIVE
Leukocytes,UA: NEGATIVE
Nitrite, UA: NEGATIVE
Protein,UA: NEGATIVE
RBC, UA: NEGATIVE
Specific Gravity, UA: 1.009 (ref 1.005–1.030)
Urobilinogen, Ur: 0.2 mg/dL (ref 0.2–1.0)
pH, UA: 7 (ref 5.0–7.5)

## 2022-11-28 LAB — IGP, APTIMA HPV: HPV Aptima: NEGATIVE

## 2022-12-30 ENCOUNTER — Other Ambulatory Visit: Payer: Self-pay

## 2022-12-30 MED ORDER — CONJ ESTROG-MEDROXYPROGEST ACE 0.3-1.5 MG PO TABS: 1.0000 | ORAL_TABLET | Freq: Every day | ORAL | 3 refills | Status: DC

## 2022-12-30 NOTE — Telephone Encounter (Signed)
Spoke with pt she change to [place] due to her insurance she cannot used [facility]

## 2022-12-31 ENCOUNTER — Other Ambulatory Visit: Payer: Self-pay

## 2022-12-31 DIAGNOSIS — L700 Acne vulgaris: Secondary | ICD-10-CM

## 2022-12-31 MED ORDER — SPIRONOLACTONE 25 MG PO TABS: 50.0000 mg | ORAL_TABLET | Freq: Every day | ORAL | 5 refills | Status: DC

## 2022-12-31 MED ORDER — DAPSONE 7.5 % EX GEL: 1.0000 "application " | Freq: Two times a day (BID) | CUTANEOUS | 2 refills | Status: DC

## 2022-12-31 NOTE — Progress Notes (Signed)
Spironolactone RX written in May 2024 for 25mg  BID. Spoke with patient and she only takes BID as needed for flares. Maintenance dosing 1 25mg  PO every day. aw

## 2023-01-03 ENCOUNTER — Encounter: Payer: Self-pay | Admitting: Nurse Practitioner

## 2023-02-05 DIAGNOSIS — F339 Major depressive disorder, recurrent, unspecified: Secondary | ICD-10-CM | POA: Diagnosis not present

## 2023-02-05 DIAGNOSIS — F411 Generalized anxiety disorder: Secondary | ICD-10-CM | POA: Diagnosis not present

## 2023-02-11 ENCOUNTER — Other Ambulatory Visit: Payer: Self-pay | Admitting: Dermatology

## 2023-02-11 DIAGNOSIS — L700 Acne vulgaris: Secondary | ICD-10-CM

## 2023-02-25 ENCOUNTER — Other Ambulatory Visit: Payer: Self-pay

## 2023-02-25 DIAGNOSIS — F411 Generalized anxiety disorder: Secondary | ICD-10-CM | POA: Diagnosis not present

## 2023-02-25 DIAGNOSIS — F339 Major depressive disorder, recurrent, unspecified: Secondary | ICD-10-CM | POA: Diagnosis not present

## 2023-02-25 MED ORDER — CONJ ESTROG-MEDROXYPROGEST ACE 0.3-1.5 MG PO TABS: 1.0000 | ORAL_TABLET | Freq: Every day | ORAL | 3 refills | Status: DC

## 2023-03-16 ENCOUNTER — Telehealth: Payer: BC Managed Care – PPO | Admitting: Nurse Practitioner

## 2023-03-16 ENCOUNTER — Encounter: Payer: Self-pay | Admitting: Nurse Practitioner

## 2023-03-16 VITALS — Resp 16 | Ht 63.0 in | Wt 130.0 lb

## 2023-03-16 DIAGNOSIS — J018 Other acute sinusitis: Secondary | ICD-10-CM | POA: Diagnosis not present

## 2023-03-16 MED ORDER — AMOXICILLIN-POT CLAVULANATE 875-125 MG PO TABS
1.0000 | ORAL_TABLET | Freq: Two times a day (BID) | ORAL | 0 refills | Status: AC
Start: 2023-03-16 — End: 2023-03-26

## 2023-03-16 NOTE — Progress Notes (Cosign Needed)
[facility] [address] [place], [place] [address]  Internal MEDICINE  Telephone Visit  Patient Name: [name]  [id number]  [id number]  Date of Service: 03/16/2023  I connected with the patient at 1630 by telephone and verified the patients identity using two identifiers.   I discussed the limitations, risks, security and privacy concerns of performing an evaluation and management service by telephone and the availability of in person appointments. I also discussed with the patient that there may be a patient responsible charge related to the service.  The patient expressed understanding and agrees to proceed.    Chief Complaint  Patient presents with   Telephone Screen    Coughing, congestion, low grade fever, body ache, covid test is negative    Telephone Assessment    HPI Tamara Simmons presents for a telehealth virtual visit for symptoms of sinusitis Negative for covid  Onset of symptoms was about 6 days ago.  Reports cough, nasal congestion, low fever, body aches, chills, headache, sinus pressure/pain, sore throat, runny nose, fatigue, nasal drainage is yellow Denies SOB, chest tightness or wheezing.    Current Medication: Outpatient Encounter Medications as of 03/16/2023  Medication Sig Note   amoxicillin-clavulanate (AUGMENTIN) 875-125 MG tablet Take 1 tablet by mouth 2 (two) times daily for 10 days. Take with food.    Cranberry-Vit C-Lactobacillus ([facility]AN[facility]) 450-30 MG TABS Take by mouth. 04/02/2015: Received from: [facility]   Dapsone (ACZONE) 7.5 % GEL Apply 1 application  topically 2 (two) times daily.    estrogen, conjugated,-medroxyprogesterone (PREMPRO) 0.3-1.5 MG tablet Take 1 tablet by mouth daily.    hepatitis B vaccine (ENGERIX-B) 20 MCG/ML injection Inject 1 mL (20 mcg total) into the muscle once as needed for up to 1 dose.    Multiple Vitamin (MULTI-VITAMINS) TABS Take by mouth. 04/02/2015: Received from: [facility]   spironolactone (ALDACTONE) 25 MG tablet Take 2 tablets (50 mg total) by mouth daily.    ST JOHNS WORT PO Take by mouth.    No facility-administered encounter medications on file as of 03/16/2023.    Surgical History: Past Surgical History:  Procedure Laterality Date   CESAREAN SECTION     TUBAL LIGATION      Medical History: Past Medical History:  Diagnosis Date   ASCUS with positive high risk HPV cervical    HSV-2 infection    Recurrent UTI     Family History: Family History  Problem Relation Age of Onset   Diabetes Mother    Lung cancer Mother    Depression Father    Prostate cancer Father    Diabetes Daughter    Depression Daughter    Bladder Cancer Neg Hx    Kidney cancer Neg Hx    Breast cancer Neg Hx     Social History   Socioeconomic History   Marital status: Married    Spouse name: Not on file   Number of children: Not on file   Years of education: Not on file   Highest education level: Not on file  Occupational History   Not on file  Tobacco Use   Smoking status: Never   Smokeless tobacco: Never  Substance and Sexual Activity   Alcohol use: Yes    Alcohol/week: 0.0 standard drinks of alcohol   Drug use: Yes    Types: Marijuana    Comment: Couple times monthly   Sexual activity: Yes  Other Topics Concern   Not on file  Social History Narrative   Not on file   Social Drivers of Health   Financial Resource Strain: Not on file  Food Insecurity: Not on file  Transportation Needs: Not on file  Physical Activity: Not on file  Stress: Not on file  Social Connections: Not on file  Intimate Partner Violence: Not on file      Review of Systems  Constitutional:  Positive for appetite change, chills, fatigue and fever.  HENT:  Positive for congestion, ear pain, postnasal drip, rhinorrhea, sinus pressure, sinus pain and sore throat.   Respiratory:  Positive for cough. Negative for chest tightness, shortness of breath  and wheezing.   Cardiovascular: Negative.  Negative for chest pain and palpitations.  Gastrointestinal:  Negative for nausea.  Musculoskeletal:  Positive for myalgias (body aches).  Neurological:  Positive for headaches.    Vital Signs: Resp 16   Ht 5\' 3"  (1.6 m)   Wt 130 lb (59 kg)   LMP 03/18/2015 Comment: BTL  BMI 23.03 kg/m    Observation/Objective: She is alert and oriented. No acute distress noted.     Assessment/Plan: 1. Acute non-recurrent sinusitis of other sinus (Primary) Augmentin prescribed. Take until gone.  - amoxicillin-clavulanate (AUGMENTIN) 875-125 MG tablet; Take 1 tablet by mouth 2 (two) times daily for 10 days. Take with food.  Dispense: 20 tablet; Refill: 0   General Counseling: Tamara Simmons verbalizes understanding of the findings of today's phone visit and agrees with plan of treatment. I have discussed any further diagnostic evaluation that may be needed or ordered today. We also reviewed her medications today. she has been encouraged to call the office with any questions or concerns that should arise related to todays visit.  Return if symptoms worsen or fail to improve.   No orders of the defined types were placed in this encounter.   Meds ordered this encounter  Medications   amoxicillin-clavulanate (AUGMENTIN) 875-125 MG tablet    Sig: Take 1 tablet by mouth 2 (two) times daily for 10 days. Take with food.    Dispense:  20 tablet    Refill:  0    Time spent:10 Minutes Time spent with patient included reviewing progress notes, labs, imaging studies, and discussing plan for follow up.  [place] Controlled Substance Database was reviewed by me for overdose risk score (ORS) if appropriate.  This patient was seen by Sallyanne Kuster, FNP-C in collaboration with Dr. Beverely Risen as a part of collaborative care agreement.  Maylin Freeburg R. Tedd Sias, MSN, FNP-C Internal medicine

## 2023-03-19 DIAGNOSIS — F411 Generalized anxiety disorder: Secondary | ICD-10-CM | POA: Diagnosis not present

## 2023-03-19 DIAGNOSIS — F339 Major depressive disorder, recurrent, unspecified: Secondary | ICD-10-CM | POA: Diagnosis not present

## 2023-03-26 DIAGNOSIS — F411 Generalized anxiety disorder: Secondary | ICD-10-CM | POA: Diagnosis not present

## 2023-03-26 DIAGNOSIS — F339 Major depressive disorder, recurrent, unspecified: Secondary | ICD-10-CM | POA: Diagnosis not present

## 2023-04-21 DIAGNOSIS — F339 Major depressive disorder, recurrent, unspecified: Secondary | ICD-10-CM | POA: Diagnosis not present

## 2023-04-21 DIAGNOSIS — F411 Generalized anxiety disorder: Secondary | ICD-10-CM | POA: Diagnosis not present

## 2023-06-13 ENCOUNTER — Other Ambulatory Visit: Payer: Self-pay | Admitting: Nurse Practitioner

## 2023-08-03 ENCOUNTER — Ambulatory Visit: Admitting: Dermatology

## 2023-08-05 ENCOUNTER — Ambulatory Visit: Payer: BC Managed Care – PPO | Admitting: Dermatology

## 2023-08-25 ENCOUNTER — Ambulatory Visit: Admitting: Dermatology

## 2023-08-25 ENCOUNTER — Encounter: Payer: Self-pay | Admitting: Dermatology

## 2023-08-25 DIAGNOSIS — D2371 Other benign neoplasm of skin of right lower limb, including hip: Secondary | ICD-10-CM

## 2023-08-25 DIAGNOSIS — L821 Other seborrheic keratosis: Secondary | ICD-10-CM

## 2023-08-25 DIAGNOSIS — Z1283 Encounter for screening for malignant neoplasm of skin: Secondary | ICD-10-CM

## 2023-08-25 DIAGNOSIS — D2271 Melanocytic nevi of right lower limb, including hip: Secondary | ICD-10-CM | POA: Diagnosis not present

## 2023-08-25 DIAGNOSIS — D489 Neoplasm of uncertain behavior, unspecified: Secondary | ICD-10-CM

## 2023-08-25 DIAGNOSIS — D229 Melanocytic nevi, unspecified: Secondary | ICD-10-CM

## 2023-08-25 DIAGNOSIS — Z808 Family history of malignant neoplasm of other organs or systems: Secondary | ICD-10-CM

## 2023-08-25 DIAGNOSIS — L578 Other skin changes due to chronic exposure to nonionizing radiation: Secondary | ICD-10-CM | POA: Diagnosis not present

## 2023-08-25 DIAGNOSIS — Z7189 Other specified counseling: Secondary | ICD-10-CM

## 2023-08-25 DIAGNOSIS — L700 Acne vulgaris: Secondary | ICD-10-CM

## 2023-08-25 DIAGNOSIS — D492 Neoplasm of unspecified behavior of bone, soft tissue, and skin: Secondary | ICD-10-CM | POA: Diagnosis not present

## 2023-08-25 DIAGNOSIS — L729 Follicular cyst of the skin and subcutaneous tissue, unspecified: Secondary | ICD-10-CM

## 2023-08-25 DIAGNOSIS — L720 Epidermal cyst: Secondary | ICD-10-CM

## 2023-08-25 DIAGNOSIS — W908XXA Exposure to other nonionizing radiation, initial encounter: Secondary | ICD-10-CM

## 2023-08-25 DIAGNOSIS — Z79899 Other long term (current) drug therapy: Secondary | ICD-10-CM

## 2023-08-25 DIAGNOSIS — L814 Other melanin hyperpigmentation: Secondary | ICD-10-CM

## 2023-08-25 MED ORDER — SPIRONOLACTONE 25 MG PO TABS: 50.0000 mg | ORAL_TABLET | Freq: Every day | ORAL | 1 refills | Status: DC

## 2023-08-25 MED ORDER — DAPSONE 7.5 % EX GEL
1.0000 | Freq: Two times a day (BID) | CUTANEOUS | 11 refills | Status: AC
Start: 2023-08-25 — End: ?

## 2023-08-25 NOTE — Progress Notes (Signed)
 Follow-Up Visit   Subjective  Tamara Simmons is a 58 y.o. female who presents for the following: Skin Cancer Screening and Full Body Skin Exam Hx of acne currently using spironolactone  25 mg bid and dapsone  gel qd  Hx of isks  Family history of melanoma on neck in father  Spots at left and right arm.   The patient presents for Total-Body Skin Exam (TBSE) for skin cancer screening and mole check. The patient has spots, moles and lesions to be evaluated, some may be new or changing and the patient may have concern these could be cancer.  The following portions of the chart were reviewed this encounter and updated as appropriate: medications, allergies, medical history  Review of Systems:  No other skin or systemic complaints except as noted in HPI or Assessment and Plan.  Objective  Well appearing patient in no apparent distress; mood and affect are within normal limits.  A full examination was performed including scalp, head, eyes, ears, nose, lips, neck, chest, axillae, abdomen, back, buttocks, bilateral upper extremities, bilateral lower extremities, hands, feet, fingers, toes, fingernails, and toenails. All findings within normal limits unless otherwise noted below.   Relevant physical exam findings are noted in the Assessment and Plan.     right medial thigh 0.6 cm brown macule    Assessment & Plan   SKIN CANCER SCREENING PERFORMED TODAY.  ACTINIC DAMAGE - Chronic condition, secondary to cumulative UV/sun exposure - diffuse scaly erythematous macules with underlying dyspigmentation - Recommend daily broad spectrum sunscreen SPF 30+ to sun-exposed areas, reapply every 2 hours as needed.  - Staying in the shade or wearing long sleeves, sun glasses (UVA+UVB protection) and wide brim hats (4-inch brim around the entire circumference of the hat) are also recommended for sun protection.  - Call for new or changing lesions.  LENTIGINES, SEBORRHEIC KERATOSES, HEMANGIOMAS -  Benign normal skin lesions - Benign-appearing - Call for any changes  MELANOCYTIC NEVI -  3 mm brown macule at right pretibial see photo  - Tan-brown and/or pink-flesh-colored symmetric macules and papules - Benign appearing on exam today - Observation - Call clinic for new or changing moles - Recommend daily use of broad spectrum spf 30+ sunscreen to sun-exposed areas.   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 Currently well-controlled. Treatment Plan: Continue Spironolactone  25 mg once a day Continue Aczone  7.5% gel twice daily.   Spironolactone  can cause increased urination and cause blood pressure to decrease. Please watch for signs of lightheadedness and be cautious when changing position. It can sometimes cause breast tenderness or an irregular period in premenopausal women. It can also increase potassium. The increase in potassium usually is not a concern unless you are taking other medicines that also increase potassium, so please be sure your doctor knows all of the other medications you are taking. This medication should not be taken by pregnant women.  This medicine should also not be taken together with sulfa drugs like Bactrim (trimethoprim/sulfamethexazole).  Long term medication management.  Patient is using long term (months to years) prescription medication  to control their dermatologic condition.  These medications require periodic monitoring to evaluate for efficacy and side effects and may require periodic laboratory monitoring.  EPIDERMAL INCLUSION CYST Exam: Subcutaneous nodule at 1.0 cm upper spinal back area   Benign-appearing. Exam most consistent with an epidermal inclusion cyst. Discussed that a cyst is a benign growth that can grow over time and sometimes  get irritated or inflamed. Recommend observation if it is not bothersome. Discussed option of surgical excision to remove it if it is growing,  symptomatic, or other changes noted. Please call for new or changing lesions so they can be evaluated.  DERMATOFIBROMA Exam: Firm pink/brown papulenodule with dimple sign at right thigh  Treatment Plan: A dermatofibroma is a benign growth possibly related to trauma, such as an insect bite, cut from shaving, or inflamed acne-type bump.  Treatment options to remove include shave or excision with resulting scar and risk of recurrence.  Since benign-appearing and not bothersome, will observe for now.   FAMILY HISTORY OF SKIN CANCER What type(s):melanoma on neck  Who affected:father   NEOPLASM OF UNCERTAIN BEHAVIOR right medial thigh Epidermal / dermal shaving  Lesion diameter (cm):  0.6 Informed consent: discussed and consent obtained   Timeout: patient name, date of birth, surgical site, and procedure verified   Procedure prep:  Patient was prepped and draped in usual sterile fashion Prep type:  Isopropyl alcohol Anesthesia: the lesion was anesthetized in a standard fashion   Anesthetic:  1% lidocaine  w/ epinephrine 1-100,000 buffered w/ 8.4% NaHCO3 Instrument used: flexible razor blade   Hemostasis achieved with: pressure, aluminum chloride and electrodesiccation   Outcome: patient tolerated procedure well   Post-procedure details: sterile dressing applied and wound care instructions given   Dressing type: bandage and petrolatum   Specimen 1 - Surgical pathology Differential Diagnosis: nevus r/o dysplasia   Check Margins: yes Nevus r/o dysplasia  ACNE VULGARIS   Related Medications Dapsone  (ACZONE ) 7.5 % GEL Apply 1 application  topically 2 (two) times daily. spironolactone  (ALDACTONE ) 25 MG tablet Take 2 tablets (50 mg total) by mouth daily. SKIN CANCER SCREENING   FAMILY HISTORY OF SKIN CANCER   ACTINIC SKIN DAMAGE   LENTIGO   MELANOCYTIC NEVUS, UNSPECIFIED LOCATION   COUNSELING AND COORDINATION OF CARE   MEDICATION MANAGEMENT   CYST OF SKIN   Return in  about 1 year (around 08/24/2024) for TBSE.  IRandee Busing, CMA, am acting as scribe for Celine Collard, MD.   Documentation: I have reviewed the above documentation for accuracy and completeness, and I agree with the above.  Celine Collard, MD

## 2023-08-25 NOTE — Patient Instructions (Addendum)
 Biopsy Wound Care Instructions  Leave the original bandage on for 24 hours if possible.  If the bandage becomes soaked or soiled before that time, it is OK to remove it and examine the wound.  A small amount of post-operative bleeding is normal.  If excessive bleeding occurs, remove the bandage, place gauze over the site and apply continuous pressure (no peeking) over the area for 30 minutes. If this does not work, please call our clinic as soon as possible or page your doctor if it is after hours.   Once a day, cleanse the wound with soap and water. It is fine to shower. If a thick crust develops you may use a Q-tip dipped into dilute hydrogen peroxide (mix 1:1 with water) to dissolve it.  Hydrogen peroxide can slow the healing process, so use it only as needed.    After washing, apply petroleum jelly (Vaseline) or an antibiotic ointment if your doctor prescribed one for you, followed by a bandage.    For best healing, the wound should be covered with a layer of ointment at all times. If you are not able to keep the area covered with a bandage to hold the ointment in place, this may mean re-applying the ointment several times a day.  Continue this wound care until the wound has healed and is no longer open.   Itching and mild discomfort is normal during the healing process. However, if you develop pain or severe itching, please call our office.   If you have any discomfort, you can take Tylenol (acetaminophen) or ibuprofen as directed on the bottle. (Please do not take these if you have an allergy to them or cannot take them for another reason).  Some redness, tenderness and white or yellow material in the wound is normal healing.  If the area becomes very sore and red, or develops a thick yellow-green material (pus), it may be infected; please notify us .    If you have stitches, return to clinic as directed to have the stitches removed. You will continue wound care for 2-3 days after the  stitches are removed.   Wound healing continues for up to one year following surgery. It is not unusual to experience pain in the scar from time to time during the interval.  If the pain becomes severe or the scar thickens, you should notify the office.    A slight amount of redness in a scar is expected for the first six months.  After six months, the redness will fade and the scar will soften and fade.  The color difference becomes less noticeable with time.  If there are any problems, return for a post-op surgery check at your earliest convenience.  To improve the appearance of the scar, you can use silicone scar gel, cream, or sheets (such as Mederma or Serica) every night for up to one year. These are available over the counter (without a prescription).  Please call our office at [phone] for any questions or concerns.    Seborrheic Keratosis  What causes seborrheic keratoses? Seborrheic keratoses are harmless, common skin growths that first appear during adult life.  As time goes by, more growths appear.  Some people may develop a large number of them.  Seborrheic keratoses appear on both covered and uncovered body parts.  They are not caused by sunlight.  The tendency to develop seborrheic keratoses can be inherited.  They vary in color from skin-colored to gray, brown, or even black.  They  can be either smooth or have a rough, warty surface.   Seborrheic keratoses are superficial and look as if they were stuck on the skin.  Under the microscope this type of keratosis looks like layers upon layers of skin.  That is why at times the top layer may seem to fall off, but the rest of the growth remains and re-grows.    Treatment Seborrheic keratoses do not need to be treated, but can easily be removed in the office.  Seborrheic keratoses often cause symptoms when they rub on clothing or jewelry.  Lesions can be in the way of shaving.  If they become inflamed, they can cause itching,  soreness, or burning.  Removal of a seborrheic keratosis can be accomplished by freezing, burning, or surgery. If any spot bleeds, scabs, or grows rapidly, please return to have it checked, as these can be an indication of a skin cancer.   Melanoma ABCDEs  Melanoma is the most dangerous type of skin cancer, and is the leading cause of death from skin disease.  You are more likely to develop melanoma if you: Have light-colored skin, light-colored eyes, or red or blond hair Spend a lot of time in the sun Tan regularly, either outdoors or in a tanning bed Have had blistering sunburns, especially during childhood Have a close family member who has had a melanoma Have atypical moles or large birthmarks  Early detection of melanoma is key since treatment is typically straightforward and cure rates are extremely high if we catch it early.   The first sign of melanoma is often a change in a mole or a new dark spot.  The ABCDE system is a way of remembering the signs of melanoma.  A for asymmetry:  The two halves do not match. B for border:  The edges of the growth are irregular. C for color:  A mixture of colors are present instead of an even brown color. D for diameter:  Melanomas are usually (but not always) greater than 6mm - the size of a pencil eraser. E for evolution:  The spot keeps changing in size, shape, and color.  Please check your skin once per month between visits. You can use a small mirror in front and a large mirror behind you to keep an eye on the back side or your body.   If you see any new or changing lesions before your next follow-up, please call to schedule a visit.  Please continue daily skin protection including broad spectrum sunscreen SPF 30+ to sun-exposed areas, reapplying every 2 hours as needed when you're outdoors.   Staying in the shade or wearing long sleeves, sun glasses (UVA+UVB protection) and wide brim hats (4-inch brim around the entire circumference of  the hat) are also recommended for sun protection.    Due to recent changes in healthcare laws, you may see results of your pathology and/or laboratory studies on MyChart before the doctors have had a chance to review them. We understand that in some cases there may be results that are confusing or concerning to you. Please understand that not all results are received at the same time and often the doctors may need to interpret multiple results in order to provide you with the best plan of care or course of treatment. Therefore, we ask that you please give us  2 business days to thoroughly review all your results before contacting the office for clarification. Should we see a critical lab result, you will be contacted  sooner.   If You Need Anything After Your Visit  If you have any questions or concerns for your doctor, please call our main line at [phone] and press option 4 to reach your doctor's medical assistant. If no one answers, please leave a voicemail as directed and we will return your call as soon as possible. Messages left after 4 pm will be answered the following business day.   You may also send us  a message via MyChart. We typically respond to MyChart messages within 1-2 business days.  For prescription refills, please ask your pharmacy to contact our office. Our fax number is [phone].  If you have an urgent issue when the clinic is closed that cannot wait until the next business day, you can page your doctor at the number below.    Please note that while we do our best to be available for urgent issues outside of office hours, we are not available 24/7.   If you have an urgent issue and are unable to reach us , you may choose to seek medical care at your doctor's office, retail clinic, urgent care center, or emergency room.  If you have a medical emergency, please immediately call 911 or go to the emergency department.  Pager Numbers  - Dr. Bary Likes: [phone]  - Dr.  Annette Barters: [phone]  - Dr. Felipe Horton: [phone]   In the event of inclement weather, please call our main line at [phone] for an update on the status of any delays or closures.  Dermatology Medication Tips: Please keep the boxes that topical medications come in in order to help keep track of the instructions about where and how to use these. Pharmacies typically print the medication instructions only on the boxes and not directly on the medication tubes.   If your medication is too expensive, please contact our office at [phone] option 4 or send us  a message through MyChart.   We are unable to tell what your co-pay for medications will be in advance as this is different depending on your insurance coverage. However, we may be able to find a substitute medication at lower cost or fill out paperwork to get insurance to cover a needed medication.   If a prior authorization is required to get your medication covered by your insurance company, please allow us  1-2 business days to complete this process.  Drug prices often vary depending on where the prescription is filled and some pharmacies may offer cheaper prices.  The website www.goodrx.com contains coupons for medications through different pharmacies. The prices here do not account for what the cost may be with help from insurance (it may be cheaper with your insurance), but the website can give you the price if you did not use any insurance.  - You can print the associated coupon and take it with your prescription to the pharmacy.  - You may also stop by our office during regular business hours and pick up a GoodRx coupon card.  - If you need your prescription sent electronically to a different pharmacy, notify our office through [facility] or by phone at [phone] option 4.     Si Usted Necesita Algo Despus de Su Visita  Tambin puede enviarnos un mensaje a travs de Clinical cytogeneticist. Por lo general respondemos a los  mensajes de MyChart en el transcurso de 1 a 2 das hbiles.  Para renovar recetas, por favor pida a su farmacia que se ponga en contacto con nuestra oficina. Franz Jacks de fax es  el [phone].  Si tiene un asunto urgente cuando la clnica est cerrada y que no puede esperar hasta el siguiente da hbil, puede llamar/localizar a su doctor(a) al nmero que aparece a continuacin.   Por favor, tenga en cuenta que aunque hacemos todo lo posible para estar disponibles para asuntos urgentes fuera del horario de [place], no estamos disponibles las 24 horas del da, los 7 [address]  [address] de la [place].   Si tiene un problema urgente y no puede comunicarse con nosotros, puede optar por buscar atencin mdica  en el consultorio de su doctor(a), en una clnica privada, en un centro de atencin urgente o en una sala de emergencias.  Si tiene Engineer, drilling, por favor llame inmediatamente al 911 o vaya a la sala de emergencias.  Nmeros de bper  - Dr. Bary Likes: [phone]  - Dra. Annette Barters: [phone]  - Dr. Felipe Horton: [phone]   En caso de inclemencias del tiempo, por favor llame a Lajuan Pila principal al [phone] para una actualizacin sobre el [place] de cualquier retraso o cierre.  Consejos para la medicacin en dermatologa: Por favor, guarde las cajas en las que vienen los medicamentos de uso tpico para ayudarle a seguir las instrucciones sobre dnde y cmo usarlos. Las farmacias generalmente imprimen las instrucciones del medicamento slo en las cajas y no directamente en los tubos del [place].   Si su medicamento es muy caro, por favor, pngase en contacto con Bettyjane Brunet llamando al [phone] y presione la opcin 4 o envenos un mensaje a travs de Clinical cytogeneticist.   No podemos decirle cul ser su copago por los medicamentos por adelantado ya que esto es diferente dependiendo de la cobertura de su seguro. Sin embargo, es posible que podamos encontrar un medicamento sustituto a  Audiological scientist un formulario para que el seguro cubra el medicamento que se considera necesario.   Si se requiere una autorizacin previa para que su compaa de seguros [place] su medicamento, por favor permtanos de 1 a 2 das hbiles para completar este proceso.  Los precios de los medicamentos varan con frecuencia dependiendo del Environmental consultant de dnde se surte la receta y alguna farmacias pueden ofrecer precios ms baratos.  El sitio web www.goodrx.com tiene cupones para medicamentos de Health and safety inspector. Los precios aqu no tienen en cuenta lo que podra costar con la ayuda del seguro (puede ser ms barato con su seguro), pero el sitio web puede darle el precio si no utiliz Tourist information centre manager.  - Puede imprimir el cupn correspondiente y llevarlo con su receta a la farmacia.  - Tambin puede pasar por nuestra oficina durante el horario de atencin regular y Education officer, museum una tarjeta de cupones de GoodRx.  - Si necesita que su receta se enve electrnicamente a una farmacia diferente, informe a nuestra oficina a travs de MyChart de [place] o por telfono llamando al [phone] y presione la opcin 4.

## 2023-08-28 ENCOUNTER — Ambulatory Visit: Payer: Self-pay | Admitting: Dermatology

## 2023-08-31 NOTE — Telephone Encounter (Signed)
-----   Message from Celine Collard sent at 08/28/2023  2:56 PM EDT ----- FINAL DIAGNOSIS        1. Skin, right medial thigh :       MELANOCYTIC NEVUS, COMPOUND LENTIGINOUS TYPE, LIMITED MARGINS FREE   Benign mole No further treatment needed ----- Message ----- From: Interface, Lab In Three Zero One Sent: 08/28/2023   1:16 PM EDT To: Elta Halter, MD

## 2023-08-31 NOTE — Telephone Encounter (Signed)
 Patient informed of pathology results

## 2023-10-07 ENCOUNTER — Other Ambulatory Visit: Payer: Self-pay | Admitting: Nurse Practitioner

## 2023-11-30 ENCOUNTER — Encounter: Payer: BC Managed Care – PPO | Admitting: Nurse Practitioner

## 2023-12-08 ENCOUNTER — Telehealth: Payer: Self-pay

## 2023-12-08 NOTE — Telephone Encounter (Signed)
 Pt called that she tested positive for Covid and she is having scratchy throat ,no fever advised gave her mychart appt tomorrow and advised her if her symptoms worse go to urgent care

## 2023-12-09 ENCOUNTER — Encounter: Payer: Self-pay | Admitting: Nurse Practitioner

## 2023-12-09 ENCOUNTER — Telehealth (INDEPENDENT_AMBULATORY_CARE_PROVIDER_SITE_OTHER): Admitting: Nurse Practitioner

## 2023-12-09 VITALS — Resp 16 | Ht 64.0 in | Wt 130.0 lb

## 2023-12-09 DIAGNOSIS — U071 COVID-19: Secondary | ICD-10-CM | POA: Diagnosis not present

## 2023-12-09 MED ORDER — NIRMATRELVIR&RITONAVIR 300/100 20 X 150 MG & 10 X 100MG PO TBPK: 3.0000 | ORAL_TABLET | Freq: Two times a day (BID) | ORAL | 0 refills | Status: AC

## 2023-12-09 NOTE — Progress Notes (Signed)
 [facility] [address] [place], [place] [address]  Internal MEDICINE  Telephone Visit  Patient Name: Tamara Simmons  [id number]  [id number]  Date of Service: 12/09/2023  I connected with the patient at 1200 by telephone and verified the patients identity using two identifiers.   I discussed the limitations, risks, security and privacy concerns of performing an evaluation and management service by telephone and the availability of in person appointments. I also discussed with the patient that there may be a patient responsible charge related to the service.  The patient expressed understanding and agrees to proceed.    Chief Complaint  Patient presents with   Telephone Screen    Covid positive. Just came back from.    Telephone Assessment    HPI Tamara Simmons presents for a telehealth virtual visit for --Tested positive for covid yesterday --reports sore throat, sinus drainage, body aches, nasal congestion, headache, low grade fever, and fatigue, decreased appetite.  She does take care of her elderly father who lives in their home and he is immunocompromised.   Current Medication: Outpatient Encounter Medications as of 12/09/2023  Medication Sig Note   nirmatrelvir /ritonavir  (PAXLOVID ) 20 x 150 MG & 10 x 100MG  TBPK Take 3 tablets by mouth 2 (two) times daily for 5 days.    Cranberry-Vit C-Lactobacillus ([facility]CRANBERRY) 450-30 MG TABS Take by mouth. 04/02/2015: Received from: [facility]   Dapsone  (ACZONE ) 7.5 % GEL Apply 1 application  topically 2 (two) times daily.    estrogen, conjugated,-medroxyprogesterone (PREMPRO) 0.3-1.5 MG tablet TAKE 1 TABLET BY MOUTH DAILY    hepatitis B vaccine (ENGERIX-B) 20 MCG/ML injection Inject 1 mL (20 mcg total) into the muscle once as needed for up to 1 dose.    Multiple Vitamin (MULTI-VITAMINS) TABS Take by mouth. 04/02/2015: Received from: [facility]   spironolactone  (ALDACTONE ) 25 MG  tablet Take 2 tablets (50 mg total) by mouth daily.    ST JOHNS WORT PO Take by mouth.    No facility-administered encounter medications on file as of 12/09/2023.    Surgical History: Past Surgical History:  Procedure Laterality Date   CESAREAN SECTION     TUBAL LIGATION      Medical History: Past Medical History:  Diagnosis Date   ASCUS with positive high risk HPV cervical    HSV-2 infection    Recurrent UTI     Family History: Family History  Problem Relation Age of Onset   Diabetes Mother    Lung cancer Mother    Depression Father    Prostate cancer Father    Diabetes Daughter    Depression Daughter    Bladder Cancer Neg Hx    Kidney cancer Neg Hx    Breast cancer Neg Hx     Social History   Socioeconomic History   Marital status: Married    Spouse name: Not on file   Number of children: Not on file   Years of education: Not on file   Highest education level: Not on file  Occupational History   Not on file  Tobacco Use   Smoking status: Never   Smokeless tobacco: Never  Substance and Sexual Activity   Alcohol use: Yes    Alcohol/week: 0.0 standard drinks of alcohol   Drug use: Yes    Types: Marijuana    Comment: Couple times monthly   Sexual activity: Yes  Other Topics Concern   Not on file  Social History Narrative   Not  on file   Social Drivers of Health   Financial Resource Strain: Not on file  Food Insecurity: Not on file  Transportation Needs: Not on file  Physical Activity: Not on file  Stress: Not on file  Social Connections: Not on file  Intimate Partner Violence: Not on file      Review of Systems  Constitutional:  Positive for appetite change, chills, fatigue and fever.  HENT:  Positive for congestion, postnasal drip and sore throat. Negative for ear pain, rhinorrhea, sinus pressure, sinus pain and sneezing.   Respiratory:  Positive for cough. Negative for chest tightness, shortness of breath and wheezing.   Cardiovascular:  Negative.  Negative for chest pain and palpitations.  Gastrointestinal:  Negative for nausea.  Musculoskeletal:  Positive for myalgias (body aches).  Neurological:  Positive for headaches.    Vital Signs: Resp 16   Ht 5' 4 (1.626 m)   Wt 130 lb (59 kg)   LMP 03/18/2015 Comment: BTL  BMI 22.31 kg/m    Observation/Objective: She is alert and oriented. No acute distress noted.     Assessment/Plan: 1. COVID-19 (Primary) Paxlovid  prescribed. - nirmatrelvir /ritonavir  (PAXLOVID ) 20 x 150 MG & 10 x 100MG  TBPK; Take 3 tablets by mouth 2 (two) times daily for 5 days.  Dispense: 30 tablet; Refill: 0   General Counseling: Everlean verbalizes understanding of the findings of today's phone visit and agrees with plan of treatment. I have discussed any further diagnostic evaluation that may be needed or ordered today. We also reviewed her medications today. she has been encouraged to call the office with any questions or concerns that should arise related to todays visit.  Return if symptoms worsen or fail to improve.   No orders of the defined types were placed in this encounter.   Meds ordered this encounter  Medications   nirmatrelvir /ritonavir  (PAXLOVID ) 20 x 150 MG & 10 x 100MG  TBPK    Sig: Take 3 tablets by mouth 2 (two) times daily for 5 days.    Dispense:  30 tablet    Refill:  0    Patient GFR is 86.    Time spent:20 Minutes Time spent with patient included reviewing progress notes, labs, imaging studies, and discussing plan for follow up.  [place] Controlled Substance Database was reviewed by me for overdose risk score (ORS) if appropriate.  This patient was seen by Mardy Maxin, FNP-C in collaboration with Dr. Sigrid Bathe as a part of collaborative care agreement.  Kaelene Elliston R. Maxin, MSN, FNP-C Internal medicine

## 2023-12-16 ENCOUNTER — Ambulatory Visit: Admitting: Nurse Practitioner

## 2023-12-16 ENCOUNTER — Encounter: Payer: Self-pay | Admitting: Nurse Practitioner

## 2023-12-16 VITALS — BP 120/80 | HR 80 | Temp 98.2°F | Resp 16 | Ht 63.0 in | Wt 126.6 lb

## 2023-12-16 DIAGNOSIS — E559 Vitamin D deficiency, unspecified: Secondary | ICD-10-CM

## 2023-12-16 DIAGNOSIS — B3731 Acute candidiasis of vulva and vagina: Secondary | ICD-10-CM | POA: Diagnosis not present

## 2023-12-16 DIAGNOSIS — Z1231 Encounter for screening mammogram for malignant neoplasm of breast: Secondary | ICD-10-CM

## 2023-12-16 DIAGNOSIS — Z0001 Encounter for general adult medical examination with abnormal findings: Secondary | ICD-10-CM

## 2023-12-16 DIAGNOSIS — R7303 Prediabetes: Secondary | ICD-10-CM

## 2023-12-16 DIAGNOSIS — E782 Mixed hyperlipidemia: Secondary | ICD-10-CM | POA: Diagnosis not present

## 2023-12-16 MED ORDER — FLUCONAZOLE 150 MG PO TABS
150.0000 mg | ORAL_TABLET | Freq: Once | ORAL | 0 refills | Status: AC
Start: 2023-12-16 — End: 2023-12-16

## 2023-12-16 NOTE — Progress Notes (Signed)
 [facility] [address] [place], [place] [address]  Internal MEDICINE  Office Visit Note  Patient Name: Tamara Simmons  [id number]  [id number]  Date of Service: 12/16/2023  Chief Complaint  Patient presents with   Annual Exam    HPI Xela presents for an annual well visit and physical exam.  Well-appearing 58 y.o. female with prediabetes, menopause, vitamin D  deficiency and high cholesterol.  Routine CRC screening: due in 2026 next year  Routine mammogram: due now DEXA scan: not due yet  Pap smear: due in 2029 Labs: due or routine labs now  New or worsening pain: none  Other concerns: none  Getting covid vaccine later today, need to update record.  Getting HRT through online provider.  Has gotten past the recent covid infection and is feeling better now.   Current Medication: Outpatient Encounter Medications as of 12/16/2023  Medication Sig Note   DHEA 25 MG CAPS Take by mouth.    estradiol (ESTRACE) 2 MG tablet Take 2 mg by mouth daily.    fluconazole (DIFLUCAN) 150 MG tablet Take 1 tablet (150 mg total) by mouth once for 1 dose. May take an additional dose after 3 days if still symptomatic.    progesterone (PROMETRIUM) 200 MG capsule Take 200 mg by mouth daily.    Cranberry-Vit C-Lactobacillus ([facility]CRANB[facility]Y) 450-30 MG TABS Take by mouth. 04/02/2015: Received from: [facility]   Dapsone  (ACZONE ) 7.5 % GEL Apply 1 application  topically 2 (two) times daily.    hepatitis B vaccine (ENGERIX-B) 20 MCG/ML injection Inject 1 mL (20 mcg total) into the muscle once as needed for up to 1 dose.    Multiple Vitamin (MULTI-VITAMINS) TABS Take by mouth. 04/02/2015: Received from: [facility]   spironolactone  (ALDACTONE ) 25 MG tablet Take 2 tablets (50 mg total) by mouth daily.    ST JOHNS WORT PO Take by mouth.    [DISCONTINUED] estrogen, conjugated,-medroxyprogesterone (PREMPRO) 0.3-1.5 MG tablet TAKE 1 TABLET BY MOUTH  DAILY    No facility-administered encounter medications on file as of 12/16/2023.    Surgical History: Past Surgical History:  Procedure Laterality Date   CESAREAN SECTION     TUBAL LIGATION      Medical History: Past Medical History:  Diagnosis Date   ASCUS with positive high risk HPV cervical    HSV-2 infection    Recurrent UTI     Family History: Family History  Problem Relation Age of Onset   Diabetes Mother    Lung cancer Mother    Depression Father    Prostate cancer Father    Diabetes Daughter    Depression Daughter    Bladder Cancer Neg Hx    Kidney cancer Neg Hx    Breast cancer Neg Hx     Social History   Socioeconomic History   Marital status: Married    Spouse name: Not on file   Number of children: Not on file   Years of education: Not on file   Highest education level: Not on file  Occupational History   Not on file  Tobacco Use   Smoking status: Never   Smokeless tobacco: Never  Substance and Sexual Activity   Alcohol use: Yes    Alcohol/week: 0.0 standard drinks of alcohol   Drug use: Yes    Types: Marijuana    Comment: Couple times monthly   Sexual activity: Yes  Other Topics Concern   Not on file  Social History Narrative  Not on file   Social Drivers of Health   Financial Resource Strain: Not on file  Food Insecurity: Not on file  Transportation Needs: Not on file  Physical Activity: Not on file  Stress: Not on file  Social Connections: Not on file  Intimate Partner Violence: Not on file      Review of Systems  Constitutional:  Negative for activity change, appetite change, chills, fatigue, fever and unexpected weight change.  HENT: Negative.  Negative for congestion, ear pain, postnasal drip, rhinorrhea, sneezing, sore throat and trouble swallowing.   Eyes: Negative.  Negative for redness.  Respiratory: Negative.  Negative for cough, chest tightness, shortness of breath and wheezing.   Cardiovascular: Negative.  Negative  for chest pain and palpitations.  Gastrointestinal: Negative.  Negative for abdominal pain, blood in stool, constipation, diarrhea, nausea and vomiting.  Endocrine: Negative.   Genitourinary: Negative.  Negative for difficulty urinating, dysuria, frequency, hematuria and urgency.  Musculoskeletal: Negative.  Negative for arthralgias, back pain, joint swelling, myalgias and neck pain.  Skin: Negative.  Negative for rash and wound.  Allergic/Immunologic: Negative.  Negative for immunocompromised state.  Neurological: Negative.  Negative for dizziness, tremors, seizures, numbness and headaches.  Hematological: Negative.  Negative for adenopathy. Does not bruise/bleed easily.  Psychiatric/Behavioral: Negative.  Negative for behavioral problems (Depression), self-injury, sleep disturbance and suicidal ideas. The patient is not nervous/anxious.     Vital Signs: BP 120/80   Pulse 80   Temp 98.2 F (36.8 C)   Resp 16   Ht 5' 3 (1.6 m)   Wt 126 lb 9.6 oz (57.4 kg)   LMP 03/18/2015 Comment: BTL  SpO2 99%   BMI 22.43 kg/m    Physical Exam Vitals reviewed.  Constitutional:      General: She is awake.     Appearance: Normal appearance. She is well-developed, well-groomed and normal weight. She is not ill-appearing, toxic-appearing or diaphoretic.  HENT:     Head: Normocephalic and atraumatic.     Right Ear: Tympanic membrane, ear canal and external ear normal.     Left Ear: Tympanic membrane, ear canal and external ear normal.     Nose: Nose normal. No congestion or rhinorrhea.     Mouth/Throat:     Mouth: Mucous membranes are moist.     Pharynx: Oropharynx is clear. No oropharyngeal exudate or posterior oropharyngeal erythema.  Eyes:     Extraocular Movements: Extraocular movements intact.     Conjunctiva/sclera: Conjunctivae normal.     Pupils: Pupils are equal, round, and reactive to light.  Cardiovascular:     Rate and Rhythm: Normal rate and regular rhythm.     Heart sounds:  Normal heart sounds. No murmur heard. Pulmonary:     Effort: Pulmonary effort is normal. No respiratory distress.     Breath sounds: Normal breath sounds. No wheezing.  Abdominal:     Hernia: There is no hernia in the left inguinal area or right inguinal area.  Genitourinary:    General: Normal vulva.     Exam position: Lithotomy position.     Pubic Area: No rash or pubic lice.      Labia:        Right: No rash, tenderness, lesion or injury.        Left: No rash, tenderness, lesion or injury.      Urethra: No prolapse, urethral pain or urethral swelling.     Vagina: Normal. No signs of injury and foreign body. No vaginal discharge, erythema,  tenderness, bleeding, lesions or prolapsed vaginal walls.     Cervix: Normal. No cervical motion tenderness, discharge, friability, lesion, erythema, cervical bleeding or eversion.     Uterus: Normal. Not deviated, not enlarged, not fixed, not tender and no uterine prolapse.      Adnexa: Right adnexa normal and left adnexa normal.       Right: No mass, tenderness or fullness.         Left: No mass, tenderness or fullness.       Rectum: Normal. No mass, anal fissure or external hemorrhoid.  Lymphadenopathy:     Lower Body: No right inguinal adenopathy. No left inguinal adenopathy.  Neurological:     Mental Status: She is alert and oriented to person, place, and time.     Cranial Nerves: No cranial nerve deficit.     Coordination: Coordination normal.     Gait: Gait normal.  Psychiatric:        Behavior: Behavior is cooperative.        Assessment/Plan: 1. Encounter for routine adult health examination with abnormal findings (Primary) Age-appropriate preventive screenings and vaccinations discussed, annual physical exam completed. Routine labs for health maintenance ordered, see below. PHM updated.   - CBC with Differential/Platelet - CMP14+EGFR - Lipid Profile - Hgb A1C w/o eAG  2. Prediabetes Routine labs ordered  - CBC with  Differential/Platelet - CMP14+EGFR - Lipid Profile - Hgb A1C w/o eAG  3. Mixed hyperlipidemia Routine labs ordered  - CBC with Differential/Platelet - CMP14+EGFR - Lipid Profile - Hgb A1C w/o eAG  4. Vitamin D  deficiency OTC supplement recommended   5. Vulvovaginal candidiasis Take fluconazole as prescribed  - fluconazole (DIFLUCAN) 150 MG tablet; Take 1 tablet (150 mg total) by mouth once for 1 dose. May take an additional dose after 3 days if still symptomatic.  Dispense: 3 tablet; Refill: 0  6. Encounter for screening mammogram for malignant neoplasm of breast Routine mammogram ordered  - MM 3D SCREENING MAMMOGRAM BILATERAL BREAST; Future    General Counseling: Jaquitta verbalizes understanding of the findings of todays visit and agrees with plan of treatment. I have discussed any further diagnostic evaluation that may be needed or ordered today. We also reviewed her medications today. she has been encouraged to call the office with any questions or concerns that should arise related to todays visit.    Orders Placed This Encounter  Procedures   MM 3D SCREENING MAMMOGRAM BILATERAL BREAST   CBC with Differential/Platelet   CMP14+EGFR   Lipid Profile   Hgb A1C w/o eAG    Meds ordered this encounter  Medications   fluconazole (DIFLUCAN) 150 MG tablet    Sig: Take 1 tablet (150 mg total) by mouth once for 1 dose. May take an additional dose after 3 days if still symptomatic.    Dispense:  3 tablet    Refill:  0    Return in about 1 year (around 12/15/2024) for CPE, Makayla Confer PCP and otherwise as needed, prefers phone call for lab results .   Total time spent:30 Minutes Time spent includes review of chart, medications, test results, and follow up plan with the patient.   [place] Controlled Substance Database was reviewed by me.  This patient was seen by Mardy Maxin, FNP-C in collaboration with Dr. Sigrid Bathe as a part of collaborative care agreement.  Stanley Helmuth R.  Maxin, MSN, FNP-C Internal medicine

## 2023-12-18 DIAGNOSIS — E782 Mixed hyperlipidemia: Secondary | ICD-10-CM | POA: Diagnosis not present

## 2023-12-18 DIAGNOSIS — R7303 Prediabetes: Secondary | ICD-10-CM | POA: Diagnosis not present

## 2023-12-19 LAB — LIPID PANEL
Chol/HDL Ratio: 3.9 ratio (ref 0.0–4.4)
Cholesterol, Total: 244 mg/dL — ABNORMAL HIGH (ref 100–199)
HDL: 62 mg/dL (ref >39)
LDL Chol Calc (NIH): 160 mg/dL — ABNORMAL HIGH (ref 0–99)
Triglycerides: 123 mg/dL (ref 0–149)
VLDL Cholesterol Cal: 22 mg/dL (ref 5–40)

## 2023-12-19 LAB — CBC WITH DIFFERENTIAL/PLATELET
Basophils Absolute: 0 x10E3/uL (ref 0.0–0.2)
Basos: 1 %
EOS (ABSOLUTE): 0.1 x10E3/uL (ref 0.0–0.4)
Eos: 1 %
Hematocrit: 42.4 % (ref 34.0–46.6)
Hemoglobin: 13.6 g/dL (ref 11.1–15.9)
Immature Grans (Abs): 0 x10E3/uL (ref 0.0–0.1)
Immature Granulocytes: 0 %
Lymphocytes Absolute: 2 x10E3/uL (ref 0.7–3.1)
Lymphs: 26 %
MCH: 31.3 pg (ref 26.6–33.0)
MCHC: 32.1 g/dL (ref 31.5–35.7)
MCV: 98 fL — ABNORMAL HIGH (ref 79–97)
Monocytes Absolute: 0.7 x10E3/uL (ref 0.1–0.9)
Monocytes: 9 %
Neutrophils Absolute: 4.8 x10E3/uL (ref 1.4–7.0)
Neutrophils: 63 %
Platelets: 432 x10E3/uL (ref 150–450)
RBC: 4.35 x10E6/uL (ref 3.77–5.28)
RDW: 11.8 % (ref 11.7–15.4)
WBC: 7.5 x10E3/uL (ref 3.4–10.8)

## 2023-12-19 LAB — SPECIMEN STATUS REPORT

## 2023-12-19 LAB — CMP14+EGFR
ALT: 27 IU/L (ref 0–32)
AST: 28 IU/L (ref 0–40)
Albumin: 4.3 g/dL (ref 3.8–4.9)
Alkaline Phosphatase: 77 IU/L (ref 49–135)
BUN/Creatinine Ratio: 17 (ref 9–23)
BUN: 13 mg/dL (ref 6–24)
Bilirubin Total: 0.2 mg/dL (ref 0.0–1.2)
CO2: 23 mmol/L (ref 20–29)
Calcium: 8.9 mg/dL (ref 8.7–10.2)
Chloride: 100 mmol/L (ref 96–106)
Creatinine, Ser: 0.76 mg/dL (ref 0.57–1.00)
Globulin, Total: 3 g/dL (ref 1.5–4.5)
Glucose: 94 mg/dL (ref 70–99)
Potassium: 4.7 mmol/L (ref 3.5–5.2)
Sodium: 136 mmol/L (ref 134–144)
Total Protein: 7.3 g/dL (ref 6.0–8.5)
eGFR: 91 mL/min/1.73 (ref >59)

## 2023-12-19 LAB — HGB A1C W/O EAG: Hgb A1c MFr Bld: 5.5 % (ref 4.8–5.6)

## 2024-01-05 ENCOUNTER — Telehealth: Payer: Self-pay

## 2024-01-05 NOTE — Telephone Encounter (Signed)
 Patient called Tamara Simmons on VM she has a question about her rx for Dapasone gel.    Tamara Simmons on VM returning her call please call back

## 2024-01-11 ENCOUNTER — Ambulatory Visit: Payer: Self-pay | Admitting: Internal Medicine

## 2024-01-13 ENCOUNTER — Other Ambulatory Visit: Payer: Self-pay

## 2024-01-13 ENCOUNTER — Encounter: Payer: Self-pay | Admitting: Nurse Practitioner

## 2024-01-13 MED ORDER — ROSUVASTATIN CALCIUM 5 MG PO TABS: 5.0000 mg | ORAL_TABLET | Freq: Every day | ORAL | 3 refills | Status: DC

## 2024-01-13 NOTE — Telephone Encounter (Signed)
 LVM for patient regarding cholesterol labs and that Crestor 5mg  has been sent per [facility].

## 2024-01-13 NOTE — Progress Notes (Signed)
 Please send low fat and low cholesterol, send crestor 5 mg po every day # 30  3 refills

## 2024-01-14 ENCOUNTER — Other Ambulatory Visit: Payer: Self-pay | Admitting: Medical Genetics

## 2024-01-14 ENCOUNTER — Ambulatory Visit
Admission: RE | Admit: 2024-01-14 | Discharge: 2024-01-14 | Disposition: A | Discharge status: Home / Self Care | Source: Ambulatory Visit | Attending: Nurse Practitioner | Admitting: Nurse Practitioner

## 2024-01-14 DIAGNOSIS — Z1231 Encounter for screening mammogram for malignant neoplasm of breast: Secondary | ICD-10-CM | POA: Diagnosis not present

## 2024-01-15 ENCOUNTER — Encounter: Payer: Self-pay | Admitting: Nurse Practitioner

## 2024-01-16 ENCOUNTER — Other Ambulatory Visit
Admission: RE | Admit: 2024-01-16 | Discharge: 2024-01-16 | Disposition: A | Payer: Self-pay | Source: Ambulatory Visit | Attending: Medical Genetics | Admitting: Medical Genetics

## 2024-01-19 ENCOUNTER — Encounter: Payer: Self-pay | Admitting: Nurse Practitioner

## 2024-01-19 ENCOUNTER — Ambulatory Visit: Admitting: Nurse Practitioner

## 2024-01-19 DIAGNOSIS — Z0001 Encounter for general adult medical examination with abnormal findings: Secondary | ICD-10-CM

## 2024-01-19 DIAGNOSIS — R7303 Prediabetes: Secondary | ICD-10-CM

## 2024-01-19 DIAGNOSIS — E782 Mixed hyperlipidemia: Secondary | ICD-10-CM | POA: Diagnosis not present

## 2024-01-19 DIAGNOSIS — Z7189 Other specified counseling: Secondary | ICD-10-CM

## 2024-01-19 DIAGNOSIS — N951 Menopausal and female climacteric states: Secondary | ICD-10-CM

## 2024-01-19 DIAGNOSIS — Z5320 Procedure and treatment not carried out because of patient's decision for unspecified reasons: Secondary | ICD-10-CM

## 2024-01-19 MED ORDER — DHEA 25 MG PO CAPS
25.0000 | ORAL_CAPSULE | Freq: Every day | ORAL | 1 refills | Status: AC
Start: 2024-01-19 — End: ?

## 2024-01-19 MED ORDER — ESTRADIOL 2 MG PO TABS: 2.0000 mg | ORAL_TABLET | Freq: Every day | ORAL | 1 refills | Status: AC

## 2024-01-19 MED ORDER — PROGESTERONE 200 MG PO CAPS: 200.0000 mg | ORAL_CAPSULE | Freq: Every day | ORAL | 1 refills | Status: AC

## 2024-01-19 NOTE — Progress Notes (Signed)
 [facility] [address] [place], [place] [address]  Internal MEDICINE  Office Visit Note  Patient Name: Tamara Simmons  [id number]  [id number]  Date of Service: 01/19/2024  Chief Complaint  Patient presents with   Follow-up    Discuss statin Discuss family history afib    HPI Tamara Simmons presents for a follow-up visit for high cholesterol, HRT and family history of AFIB.  High cholesterol -- does not want to start a statin right now. Would like to focus on hormone replacement and diet first. She reports that she has read some information about hormone replacement therapy having a positive effect on cholesterol levels.  HRT -- on DHEA, estradiol  and progesterone , wants to have this done with PCP, only went to the specialist for 1 visit. The specialist did not obtain any baseline labs.  Family history of atrial fibrillation in multiple family members.  Prediabetes -- wants to continue working on diet.     Current Medication: Outpatient Encounter Medications as of 01/19/2024  Medication Sig Note   Cranberry-Vit C-Lactobacillus ([facility]AN[facility]) 450-30 MG TABS Take by mouth. 04/02/2015: Received from: [facility]   Dapsone  (ACZONE ) 7.5 % GEL Apply 1 application  topically 2 (two) times daily.    DHEA 25 MG CAPS Take 25 capsules (625 mg total) by mouth daily.    estradiol  (ESTRACE ) 2 MG tablet Take 1 tablet (2 mg total) by mouth daily.    hepatitis B vaccine (ENGERIX-B) 20 MCG/ML injection Inject 1 mL (20 mcg total) into the muscle once as needed for up to 1 dose.    Multiple Vitamin (MULTI-VITAMINS) TABS Take by mouth. 04/02/2015: Received from: [facility]   progesterone  (PROMETRIUM ) 200 MG capsule Take 1 capsule (200 mg total) by mouth daily.    spironolactone  (ALDACTONE ) 25 MG tablet Take 2 tablets (50 mg total) by mouth daily.    ST JOHNS WORT PO Take by mouth.    [DISCONTINUED] DHEA 25 MG CAPS Take by mouth.     [DISCONTINUED] estradiol  (ESTRACE ) 2 MG tablet Take 2 mg by mouth daily.    [DISCONTINUED] progesterone  (PROMETRIUM ) 200 MG capsule Take 200 mg by mouth daily.    [DISCONTINUED] rosuvastatin  (CRESTOR ) 5 MG tablet Take 1 tablet (5 mg total) by mouth daily. (Patient not taking: Reported on 01/19/2024)    No facility-administered encounter medications on file as of 01/19/2024.    Surgical History: Past Surgical History:  Procedure Laterality Date   CESAREAN SECTION     TUBAL LIGATION      Medical History: Past Medical History:  Diagnosis Date   ASCUS with positive high risk HPV cervical    HSV-2 infection    Recurrent UTI     Family History: Family History  Problem Relation Age of Onset   Diabetes Mother    Lung cancer Mother    Depression Father    Prostate cancer Father    Diabetes Daughter    Depression Daughter    Bladder Cancer Neg Hx    Kidney cancer Neg Hx    Breast cancer Neg Hx     Social History   Socioeconomic History   Marital status: Married    Spouse name: Not on file   Number of children: Not on file   Years of education: Not on file   Highest education level: Not on file  Occupational History   Not on file  Tobacco Use   Smoking status: Never   Smokeless tobacco: Never  Substance and Sexual Activity   Alcohol use: Yes    Alcohol/week: 0.0 standard drinks of alcohol   Drug use: Yes    Types: Marijuana    Comment: Couple times monthly   Sexual activity: Yes  Other Topics Concern   Not on file  Social History Narrative   Not on file   Social Drivers of Health   Financial Resource Strain: Not on file  Food Insecurity: Not on file  Transportation Needs: Not on file  Physical Activity: Not on file  Stress: Not on file  Social Connections: Not on file  Intimate Partner Violence: Not on file      Review of Systems  Constitutional:  Negative for chills, fatigue and unexpected weight change.  HENT:  Negative for congestion, postnasal drip,  rhinorrhea, sneezing and sore throat.   Eyes:  Negative for redness.  Respiratory:  Negative for cough, chest tightness and shortness of breath.   Cardiovascular:  Negative for chest pain and palpitations.  Gastrointestinal:  Negative for abdominal pain, constipation, diarrhea, nausea and vomiting.  Genitourinary:  Negative for dysuria and frequency.  Musculoskeletal:  Negative for arthralgias, back pain, joint swelling and neck pain.  Skin:  Negative for rash.  Neurological: Negative.  Negative for tremors and numbness.  Hematological:  Negative for adenopathy. Does not bruise/bleed easily.  Psychiatric/Behavioral:  Negative for behavioral problems (Depression), sleep disturbance and suicidal ideas. The patient is not nervous/anxious.     Vital Signs: BP 122/78   Pulse 79   Temp (!) 97.1 F (36.2 C)   Resp 16   Ht 5' 3 (1.6 m)   Wt 123 lb 9.6 oz (56.1 kg)   LMP 03/18/2015 Comment: BTL  SpO2 99%   BMI 21.89 kg/m    Physical Exam Vitals reviewed.  Constitutional:      General: She is not in acute distress.    Appearance: Normal appearance. She is normal weight. She is not ill-appearing.  HENT:     Head: Normocephalic and atraumatic.  Eyes:     Pupils: Pupils are equal, round, and reactive to light.  Cardiovascular:     Rate and Rhythm: Normal rate and regular rhythm.  Pulmonary:     Effort: Pulmonary effort is normal. No respiratory distress.  Neurological:     Mental Status: She is alert and oriented to person, place, and time.  Psychiatric:        Mood and Affect: Mood normal.        Behavior: Behavior normal.        Assessment/Plan: 1. Prediabetes Continue working on diet modifications as discussed and increase physical activity as tolerated.  2. Mixed hyperlipidemia Continue working on diet modifications, increase physical activity as tolerated and continue HRT as prescribed.   3. Vasomotor symptoms due to menopause Baseline labs ordered and continue  current HRT regimen as prescribed. - estradiol  (ESTRACE ) 2 MG tablet; Take 1 tablet (2 mg total) by mouth daily.  Dispense: 90 tablet; Refill: 1 - progesterone  (PROMETRIUM ) 200 MG capsule; Take 1 capsule (200 mg total) by mouth daily.  Dispense: 90 capsule; Refill: 1 - DHEA 25 MG CAPS; Take 25 capsules (625 mg total) by mouth daily.  Dispense: 90 capsule; Refill: 1  4. Counseling for hormone replacement therapy Baseline labs ordered and continue current HRT regimen as prescribed. - estradiol  (ESTRACE ) 2 MG tablet; Take 1 tablet (2 mg total) by mouth daily.  Dispense: 90 tablet; Refill: 1 - progesterone  (PROMETRIUM ) 200 MG capsule; Take 1 capsule (  200 mg total) by mouth daily.  Dispense: 90 capsule; Refill: 1 - DHEA 25 MG CAPS; Take 25 capsules (625 mg total) by mouth daily.  Dispense: 90 capsule; Refill: 1 - Progesterone  - Estradiol  - Testosterone , Women/Child  5. Statin medication declined by patient Will not take statin at this time. Prefers to work on diet and hormone replacement therapy.    General Counseling: Tamara Simmons verbalizes understanding of the findings of todays visit and agrees with plan of treatment. I have discussed any further diagnostic evaluation that may be needed or ordered today. We also reviewed her medications today. she has been encouraged to call the office with any questions or concerns that should arise related to todays visit.    Orders Placed This Encounter  Procedures   Progesterone    Estradiol    Testosterone , Women/Child    Meds ordered this encounter  Medications   estradiol  (ESTRACE ) 2 MG tablet    Sig: Take 1 tablet (2 mg total) by mouth daily.    Dispense:  90 tablet    Refill:  1    For future refills   progesterone  (PROMETRIUM ) 200 MG capsule    Sig: Take 1 capsule (200 mg total) by mouth daily.    Dispense:  90 capsule    Refill:  1    For future refills   DHEA 25 MG CAPS    Sig: Take 25 capsules (625 mg total) by mouth daily.     Dispense:  90 capsule    Refill:  1    For future refills    Return in about 6 months (around 07/18/2024) for F/U, Dezra Mandella PCP HRT and lipids.   Total time spent:30 Minutes Time spent includes review of chart, medications, test results, and follow up plan with the patient.   [place] Controlled Substance Database was reviewed by me.  This patient was seen by Mardy Maxin, FNP-C in collaboration with Dr. Sigrid Bathe as a part of collaborative care agreement.   Mercie Balsley R. Maxin, MSN, FNP-C Internal medicine

## 2024-01-22 DIAGNOSIS — Z7189 Other specified counseling: Secondary | ICD-10-CM | POA: Diagnosis not present

## 2024-01-25 LAB — GENECONNECT MOLECULAR SCREEN: Genetic Analysis Overall Interpretation: NEGATIVE

## 2024-02-03 LAB — ESTRADIOL: Estradiol: 72.1 pg/mL

## 2024-02-03 LAB — PROGESTERONE: Progesterone: 7.1 ng/mL

## 2024-02-03 LAB — TESTOSTERONE, WOMEN/CHILD: Testosterone, Serum (Total): 26 ng/dL

## 2024-02-14 ENCOUNTER — Encounter: Payer: Self-pay | Admitting: Nurse Practitioner

## 2024-02-14 DIAGNOSIS — N951 Menopausal and female climacteric states: Secondary | ICD-10-CM | POA: Insufficient documentation

## 2024-03-02 ENCOUNTER — Other Ambulatory Visit: Payer: Self-pay | Admitting: Dermatology

## 2024-03-02 DIAGNOSIS — L700 Acne vulgaris: Secondary | ICD-10-CM

## 2024-07-19 ENCOUNTER — Ambulatory Visit: Admitting: Nurse Practitioner

## 2024-08-24 ENCOUNTER — Ambulatory Visit: Admitting: Dermatology

## 2024-12-19 ENCOUNTER — Encounter: Admitting: Nurse Practitioner
